# Patient Record
Sex: Male | Born: 1990 | Race: Black or African American | Hispanic: No | Marital: Single | Smoking: Current some day smoker
Health system: Southern US, Community
[De-identification: ages and names within clinical notes are randomized; demographics above are authoritative.]

## PROBLEM LIST (undated history)

## (undated) DIAGNOSIS — W3400XA Accidental discharge from unspecified firearms or gun, initial encounter: Secondary | ICD-10-CM

## (undated) HISTORY — PX: ABDOMINAL SURGERY: SHX537

---
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
## 2011-09-03 ENCOUNTER — Encounter (HOSPITAL_COMMUNITY): Payer: Self-pay

## 2011-09-03 ENCOUNTER — Emergency Department (HOSPITAL_COMMUNITY)
Admission: EM | Admit: 2011-09-03 | Discharge: 2011-09-03 | Disposition: A | Discharge status: Home / Self Care | Payer: Self-pay | Attending: Emergency Medicine | Admitting: Emergency Medicine

## 2011-09-03 ENCOUNTER — Emergency Department (HOSPITAL_COMMUNITY): Payer: Self-pay

## 2011-09-03 DIAGNOSIS — R42 Dizziness and giddiness: Secondary | ICD-10-CM | POA: Insufficient documentation

## 2011-09-03 DIAGNOSIS — R4182 Altered mental status, unspecified: Secondary | ICD-10-CM | POA: Insufficient documentation

## 2011-09-03 DIAGNOSIS — R0602 Shortness of breath: Secondary | ICD-10-CM | POA: Insufficient documentation

## 2011-09-03 DIAGNOSIS — R079 Chest pain, unspecified: Secondary | ICD-10-CM | POA: Insufficient documentation

## 2011-09-03 DIAGNOSIS — T07XXXA Unspecified multiple injuries, initial encounter: Secondary | ICD-10-CM

## 2011-09-03 DIAGNOSIS — S060XAA Concussion with loss of consciousness status unknown, initial encounter: Secondary | ICD-10-CM | POA: Insufficient documentation

## 2011-09-03 DIAGNOSIS — R05 Cough: Secondary | ICD-10-CM | POA: Insufficient documentation

## 2011-09-03 DIAGNOSIS — T148XXA Other injury of unspecified body region, initial encounter: Secondary | ICD-10-CM | POA: Insufficient documentation

## 2011-09-03 DIAGNOSIS — R209 Unspecified disturbances of skin sensation: Secondary | ICD-10-CM | POA: Insufficient documentation

## 2011-09-03 DIAGNOSIS — S060X9A Concussion with loss of consciousness of unspecified duration, initial encounter: Secondary | ICD-10-CM | POA: Insufficient documentation

## 2011-09-03 DIAGNOSIS — R059 Cough, unspecified: Secondary | ICD-10-CM | POA: Insufficient documentation

## 2011-09-03 MED ORDER — MORPHINE SULFATE 4 MG/ML IJ SOLN
4.0000 mg | Freq: Once | INTRAMUSCULAR | Status: AC
  Administered 2011-09-03: 4 mg via INTRAVENOUS
  Filled 2011-09-03: qty 1

## 2011-09-03 MED ORDER — NAPROXEN 500 MG PO TABS: 500.0000 mg | ORAL_TABLET | Freq: Two times a day (BID) | ORAL | Status: DC

## 2011-09-03 MED ORDER — HYDROCODONE-ACETAMINOPHEN 5-325 MG PO TABS: 1.0000 | ORAL_TABLET | Freq: Four times a day (QID) | ORAL | Status: AC | PRN

## 2011-09-03 MED ORDER — CYCLOBENZAPRINE HCL 5 MG PO TABS: 5.0000 mg | ORAL_TABLET | Freq: Three times a day (TID) | ORAL | Status: AC | PRN
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
## 2011-10-31 ENCOUNTER — Emergency Department (HOSPITAL_COMMUNITY)
Admission: EM | Admit: 2011-10-31 | Discharge: 2011-10-31 | Disposition: A | Discharge status: Home / Self Care | Payer: Self-pay | Attending: Emergency Medicine | Admitting: Emergency Medicine

## 2011-10-31 ENCOUNTER — Encounter (HOSPITAL_COMMUNITY): Payer: Self-pay | Admitting: Physical Medicine and Rehabilitation

## 2011-10-31 DIAGNOSIS — IMO0002 Reserved for concepts with insufficient information to code with codable children: Secondary | ICD-10-CM | POA: Insufficient documentation

## 2011-10-31 DIAGNOSIS — T07XXXA Unspecified multiple injuries, initial encounter: Secondary | ICD-10-CM

## 2011-10-31 DIAGNOSIS — S01501A Unspecified open wound of lip, initial encounter: Secondary | ICD-10-CM | POA: Insufficient documentation

## 2011-10-31 MED ORDER — IBUPROFEN 400 MG PO TABS: 400.0000 mg | ORAL_TABLET | Freq: Four times a day (QID) | ORAL | Status: AC | PRN

## 2011-10-31 MED ORDER — OXYCODONE-ACETAMINOPHEN 5-325 MG PO TABS
2.0000 | ORAL_TABLET | Freq: Once | ORAL | Status: AC
  Administered 2011-10-31: 2 via ORAL
  Filled 2011-10-31: qty 2

## 2011-10-31 NOTE — ED Notes (Signed)
Resident at bedside performing suture care, pt tolerating without difficulty at the time.

## 2011-10-31 NOTE — ED Provider Notes (Signed)
I saw and evaluated the patient, reviewed the resident's note and I agree with the findings and plan.  No headache no amnesia no loss of consciousness no neck pain no cervical spine tenderness he does have normal choking normal jaw occlusion dentition is stable but he does have an Rennis Harding type II fracture of tooth #9, upper lip laceration has been repaired by the resident with me immediately available for assistance.  Hurman Horn, MD 11/02/11 2227
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
## 2011-10-31 NOTE — Discharge Instructions (Signed)
Abrasions Abrasions are skin scrapes. Their treatment depends on how large and deep the abrasion is. Abrasions do not extend through all layers of the skin. A cut or lesion through all skin layers is called a laceration. HOME CARE INSTRUCTIONS   If you were given a dressing, change it at least once a day or as instructed by your caregiver. If the bandage sticks, soak it off with a solution of water or hydrogen peroxide.   Twice a day, wash the area with soap and water to remove all the cream/ointment. You may do this in a sink, under a tub faucet, or in a shower. Rinse off the soap and pat dry with a clean towel. Look for signs of infection (see below).   Reapply cream/ointment according to your caregiver's instruction. This will help prevent infection and keep the bandage from sticking. Telfa or gauze over the wound and under the dressing or wrap will also help keep the bandage from sticking.   If the bandage becomes wet, dirty, or develops a foul smell, change it as soon as possible.   Only take over-the-counter or prescription medicines for pain, discomfort, or fever as directed by your caregiver.  SEEK IMMEDIATE MEDICAL CARE IF:   Increasing pain in the wound.   Signs of infection develop: redness, swelling, surrounding area is tender to touch, or pus coming from the wound.   You have a fever.   Any foul smell coming from the wound or dressing.  Most skin wounds heal within ten days. Facial wounds heal faster. However, an infection may occur despite proper treatment. You should have the wound checked for signs of infection within 24 to 48 hours or sooner if problems arise. If you were not given a wound-check appointment, look closely at the wound yourself on the second day for early signs of infection listed above. MAKE SURE YOU:   Understand these instructions.   Will watch your condition.   Will get help right away if you are not doing well or get worse.  Document Released:  05/06/2005 Document Revised: 07/16/2011 Document Reviewed: 06/30/2011 [facility] Patient Information 2012 [place], [place].Laceration Care, Adult A laceration is a cut that goes through all layers of the skin. The cut goes into the tissue beneath the skin. HOME CARE For stitches (sutures) or staples:  Keep the cut clean and dry.   If you have a bandage (dressing), change it at least once a day. Change the bandage if it gets wet or dirty, or as told by your doctor.   Wash the cut with soap and water 2 times a day. Rinse the cut with water. Pat it dry with a clean towel.   Put a thin layer of medicated cream on the cut as told by your doctor.   You may shower after the first 24 hours. Do not soak the cut in water until the stitches are removed.   Only take medicines as told by your doctor.   Have your stitches or staples removed as told by your doctor.  For skin adhesive strips:  Keep the cut clean and dry.   Do not get the strips wet. You may take a bath, but be careful to keep the cut dry.   If the cut gets wet, pat it dry with a clean towel.   The strips will fall off on their own. Do not remove the strips that are still stuck to the cut.  For wound glue:  You may shower or take  baths. Do not soak or scrub the cut. Do not swim. Avoid heavy sweating until the glue falls off on its own. After a shower or bath, pat the cut dry with a clean towel.   Do not put medicine on your cut until the glue falls off.   If you have a bandage, do not put tape over the glue.   Avoid lots of sunlight or tanning lamps until the glue falls off. Put sunscreen on the cut for the first year to reduce your scar.   The glue will fall off on its own. Do not pick at the glue.  You may need a tetanus shot if:  You cannot remember when you had your last tetanus shot.   You have never had a tetanus shot.  If you need a tetanus shot and you choose not to have one, you may get tetanus. Sickness from tetanus  can be serious. GET HELP RIGHT AWAY IF:   Your pain does not get better with medicine.   Your arm, hand, leg, or foot loses feeling (numbness) or changes color.   Your cut is bleeding.   Your joint feels weak, or you cannot use your joint.   You have painful lumps on your body.   Your cut is red, puffy (swollen), or painful.   You have a red line on the skin near the cut.   You have yellowish-white fluid (pus) coming from the cut.   You have a fever.   You have a bad smell coming from the cut or bandage.   Your cut breaks open before or after stitches are removed.   You notice something coming out of the cut, such as wood or glass.   You cannot move a finger or toe.  MAKE SURE YOU:   Understand these instructions.   Will watch your condition.   Will get help right away if you are not doing well or get worse.  Document Released: 01/13/2008 Document Revised: 07/16/2011 Document Reviewed: 01/20/2011 [facility] Patient Information 2012 [place], [place].  Return for any new or worsening symptoms or any other concerns.
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
## 2011-12-24 ENCOUNTER — Emergency Department (HOSPITAL_COMMUNITY)
Admission: EM | Admit: 2011-12-24 | Discharge: 2011-12-24 | Disposition: A | Discharge status: Home / Self Care | Payer: Self-pay | Attending: Emergency Medicine | Admitting: Emergency Medicine

## 2011-12-24 ENCOUNTER — Emergency Department (HOSPITAL_COMMUNITY): Payer: Self-pay | Admitting: *Deleted

## 2011-12-24 ENCOUNTER — Encounter (HOSPITAL_COMMUNITY): Payer: Self-pay | Admitting: *Deleted

## 2011-12-24 ENCOUNTER — Encounter (HOSPITAL_COMMUNITY): Payer: Self-pay

## 2011-12-24 ENCOUNTER — Emergency Department (HOSPITAL_COMMUNITY): Payer: Self-pay

## 2011-12-24 ENCOUNTER — Encounter (HOSPITAL_COMMUNITY)
Admission: EM | Disposition: A | Discharge status: Home / Self Care | Payer: Self-pay | Source: Home / Self Care | Attending: Emergency Medicine

## 2011-12-24 DIAGNOSIS — L03019 Cellulitis of unspecified finger: Secondary | ICD-10-CM | POA: Insufficient documentation

## 2011-12-24 DIAGNOSIS — F17200 Nicotine dependence, unspecified, uncomplicated: Secondary | ICD-10-CM | POA: Insufficient documentation

## 2011-12-24 DIAGNOSIS — L02519 Cutaneous abscess of unspecified hand: Secondary | ICD-10-CM | POA: Insufficient documentation

## 2011-12-24 DIAGNOSIS — L0291 Cutaneous abscess, unspecified: Secondary | ICD-10-CM

## 2011-12-24 HISTORY — PX: I & D EXTREMITY: SHX5045

## 2011-12-24 LAB — CBC
HCT: 44 % (ref 39.0–52.0)
MCH: 33.1 pg (ref 26.0–34.0)
MCHC: 34.8 g/dL (ref 30.0–36.0)
MCV: 95.2 fL (ref 78.0–100.0)
Platelets: 260 10*3/uL (ref 150–400)
RDW: 12.9 % (ref 11.5–15.5)
WBC: 7.4 10*3/uL (ref 4.0–10.5)

## 2011-12-24 LAB — DIFFERENTIAL
Basophils Absolute: 0.1 10*3/uL (ref 0.0–0.1)
Basophils Relative: 1 % (ref 0–1)
Eosinophils Absolute: 0.4 10*3/uL (ref 0.0–0.7)
Eosinophils Relative: 5 % (ref 0–5)
Monocytes Absolute: 0.6 10*3/uL (ref 0.1–1.0)

## 2011-12-24 LAB — BASIC METABOLIC PANEL
CO2: 25 mEq/L (ref 19–32)
Calcium: 9.2 mg/dL (ref 8.4–10.5)
Creatinine, Ser: 0.82 mg/dL (ref 0.50–1.35)
GFR calc Af Amer: >90 mL/min (ref >90)
GFR calc non Af Amer: >90 mL/min (ref >90)
Sodium: 140 mEq/L (ref 135–145)

## 2011-12-24 SURGERY — IRRIGATION AND DEBRIDEMENT EXTREMITY
Anesthesia: General | Site: Finger | Laterality: Left | Wound class: Dirty or Infected

## 2011-12-24 MED ORDER — OXYCODONE-ACETAMINOPHEN 5-325 MG PO TABS: ORAL_TABLET | ORAL | Status: AC

## 2011-12-24 MED ORDER — VANCOMYCIN HCL IN DEXTROSE 1-5 GM/200ML-% IV SOLN
INTRAVENOUS | Status: AC
  Filled 2011-12-24: qty 200

## 2011-12-24 MED ORDER — DIPHENHYDRAMINE HCL 25 MG PO CAPS: 50.0000 mg | ORAL_CAPSULE | Freq: Once | ORAL | Status: DC

## 2011-12-24 MED ORDER — CEPHALEXIN 500 MG PO CAPS: 500.0000 mg | ORAL_CAPSULE | Freq: Four times a day (QID) | ORAL | Status: DC

## 2011-12-24 MED ORDER — LACTATED RINGERS IV SOLN
INTRAVENOUS | Status: DC | PRN
  Administered 2011-12-24: 20:00:00 via INTRAVENOUS

## 2011-12-24 MED ORDER — FENTANYL CITRATE 0.05 MG/ML IJ SOLN
INTRAMUSCULAR | Status: DC | PRN
  Administered 2011-12-24: 100 ug via INTRAVENOUS

## 2011-12-24 MED ORDER — CEPHALEXIN 250 MG PO CAPS
500.0000 mg | ORAL_CAPSULE | Freq: Once | ORAL | Status: AC
  Administered 2011-12-24: 500 mg via ORAL
  Filled 2011-12-24: qty 2

## 2011-12-24 MED ORDER — PROPOFOL 10 MG/ML IV EMUL
INTRAVENOUS | Status: DC | PRN
  Administered 2011-12-24: 200 mg via INTRAVENOUS

## 2011-12-24 MED ORDER — HYDROCODONE-ACETAMINOPHEN 5-325 MG PO TABS: 2.0000 | ORAL_TABLET | ORAL | Status: DC | PRN

## 2011-12-24 MED ORDER — BUPIVACAINE HCL (PF) 0.25 % IJ SOLN
INTRAMUSCULAR | Status: DC | PRN
  Administered 2011-12-24: 10 mL

## 2011-12-24 MED ORDER — EPHEDRINE SULFATE 50 MG/ML IJ SOLN
INTRAMUSCULAR | Status: DC | PRN
  Administered 2011-12-24 (×2): 5 mg via INTRAVENOUS

## 2011-12-24 MED ORDER — SULFAMETHOXAZOLE-TRIMETHOPRIM 800-160 MG PO TABS: 1.0000 | ORAL_TABLET | Freq: Two times a day (BID) | ORAL | Status: DC

## 2011-12-24 MED ORDER — SODIUM CHLORIDE 0.9 % IR SOLN
Status: DC | PRN
  Administered 2011-12-24: 3000 mL

## 2011-12-24 MED ORDER — CEFAZOLIN SODIUM 1-5 GM-% IV SOLN: 1.0000 g | Freq: Three times a day (TID) | INTRAVENOUS | Status: DC

## 2011-12-24 MED ORDER — SULFAMETHOXAZOLE-TMP DS 800-160 MG PO TABS
1.0000 | ORAL_TABLET | Freq: Once | ORAL | Status: AC
  Administered 2011-12-24: 1 via ORAL
  Filled 2011-12-24: qty 1

## 2011-12-24 MED ORDER — SULFAMETHOXAZOLE-TRIMETHOPRIM 800-160 MG PO TABS: 1.0000 | ORAL_TABLET | Freq: Two times a day (BID) | ORAL | Status: AC

## 2011-12-24 MED ORDER — HYDROMORPHONE HCL PF 1 MG/ML IJ SOLN: 0.2500 mg | INTRAMUSCULAR | Status: DC | PRN

## 2011-12-24 MED ORDER — LORAZEPAM 2 MG/ML IJ SOLN: 1.0000 mg | Freq: Once | INTRAMUSCULAR | Status: AC | PRN

## 2011-12-24 MED ORDER — VANCOMYCIN HCL 1000 MG IV SOLR
1000.0000 mg | INTRAVENOUS | Status: DC | PRN
  Administered 2011-12-24: 1000 mg via INTRAVENOUS

## 2011-12-24 MED ORDER — LIDOCAINE HCL (CARDIAC) 20 MG/ML IV SOLN
INTRAVENOUS | Status: DC | PRN
  Administered 2011-12-24: 100 mg via INTRAVENOUS

## 2011-12-24 MED ORDER — MIDAZOLAM HCL 5 MG/5ML IJ SOLN
INTRAMUSCULAR | Status: DC | PRN
  Administered 2011-12-24: 2 mg via INTRAVENOUS

## 2011-12-24 MED ORDER — ONDANSETRON HCL 4 MG/2ML IJ SOLN
INTRAMUSCULAR | Status: DC | PRN
  Administered 2011-12-24: 4 mg via INTRAVENOUS

## 2011-12-24 SURGICAL SUPPLY — 57 items
BANDAGE COBAN STERILE 2 (GAUZE/BANDAGES/DRESSINGS) IMPLANT
BANDAGE CONFORM 2  STR LF (GAUZE/BANDAGES/DRESSINGS) IMPLANT
BANDAGE ELASTIC 3 VELCRO ST LF (GAUZE/BANDAGES/DRESSINGS) ×2 IMPLANT
BANDAGE ELASTIC 4 VELCRO ST LF (GAUZE/BANDAGES/DRESSINGS) ×1 IMPLANT
BANDAGE GAUZE ELAST BULKY 4 IN (GAUZE/BANDAGES/DRESSINGS) ×2 IMPLANT
BNDG CMPR 9X4 STRL LF SNTH (GAUZE/BANDAGES/DRESSINGS) ×1
BNDG COHESIVE 1X5 TAN STRL LF (GAUZE/BANDAGES/DRESSINGS) IMPLANT
BNDG ESMARK 4X9 LF (GAUZE/BANDAGES/DRESSINGS) ×1 IMPLANT
CLOTH BEACON ORANGE TIMEOUT ST (SAFETY) ×2 IMPLANT
CORDS BIPOLAR (ELECTRODE) ×2 IMPLANT
COVER SURGICAL LIGHT HANDLE (MISCELLANEOUS) ×2 IMPLANT
DECANTER SPIKE VIAL GLASS SM (MISCELLANEOUS) ×1 IMPLANT
DRAIN PENROSE 1/4X12 LTX STRL (WOUND CARE) IMPLANT
DRSG ADAPTIC 3X8 NADH LF (GAUZE/BANDAGES/DRESSINGS) IMPLANT
DRSG EMULSION OIL 3X3 NADH (GAUZE/BANDAGES/DRESSINGS) ×1 IMPLANT
DRSG PAD ABDOMINAL 8X10 ST (GAUZE/BANDAGES/DRESSINGS) ×2 IMPLANT
GAUZE PACKING IODOFORM 1/4X5 (PACKING) ×1 IMPLANT
GAUZE XEROFORM 1X8 LF (GAUZE/BANDAGES/DRESSINGS) ×2 IMPLANT
GLOVE BIO SURGEON STRL SZ7.5 (GLOVE) ×2 IMPLANT
GLOVE BIOGEL PI IND STRL 8 (GLOVE) ×1 IMPLANT
GLOVE BIOGEL PI INDICATOR 8 (GLOVE) ×1
GOWN STRL NON-REIN LRG LVL3 (GOWN DISPOSABLE) ×1 IMPLANT
GOWN STRL REIN XL XLG (GOWN DISPOSABLE) ×2 IMPLANT
HAND ALUMI LG (SOFTGOODS) ×1 IMPLANT
HANDPIECE INTERPULSE COAX TIP (DISPOSABLE)
KIT BASIN OR (CUSTOM PROCEDURE TRAY) ×2 IMPLANT
KIT ROOM TURNOVER OR (KITS) ×2 IMPLANT
LOOP VESSEL MAXI BLUE (MISCELLANEOUS) IMPLANT
LOOP VESSEL MINI RED (MISCELLANEOUS) IMPLANT
MANIFOLD NEPTUNE II (INSTRUMENTS) ×2 IMPLANT
NDL HYPO 25X1 1.5 SAFETY (NEEDLE) IMPLANT
NEEDLE HYPO 25X1 1.5 SAFETY (NEEDLE) IMPLANT
NS IRRIG 1000ML POUR BTL (IV SOLUTION) ×1 IMPLANT
PACK ORTHO EXTREMITY (CUSTOM PROCEDURE TRAY) ×2 IMPLANT
PAD ARMBOARD 7.5X6 YLW CONV (MISCELLANEOUS) ×4 IMPLANT
SCRUB BETADINE 4OZ XXX (MISCELLANEOUS) ×2 IMPLANT
SET HNDPC FAN SPRY TIP SCT (DISPOSABLE) IMPLANT
SOLUTION BETADINE 4OZ (MISCELLANEOUS) ×2 IMPLANT
SPLINT PLASTER EXTRA FAST 3X15 (CAST SUPPLIES) ×1
SPLINT PLASTER GYPS XFAST 3X15 (CAST SUPPLIES) IMPLANT
SPONGE GAUZE 4X4 12PLY (GAUZE/BANDAGES/DRESSINGS) ×2 IMPLANT
SPONGE LAP 18X18 X RAY DECT (DISPOSABLE) ×1 IMPLANT
SPONGE LAP 4X18 X RAY DECT (DISPOSABLE) ×1 IMPLANT
SUCTION FRAZIER TIP 10 FR DISP (SUCTIONS) ×2 IMPLANT
SUT ETHILON 4 0 PS 2 18 (SUTURE) ×2 IMPLANT
SUT MON AB 5-0 P3 18 (SUTURE) IMPLANT
SWAB COLLECTION DEVICE MRSA (MISCELLANEOUS) ×1 IMPLANT
SYR CONTROL 10ML LL (SYRINGE) IMPLANT
SYRINGE 10CC LL (SYRINGE) ×1 IMPLANT
TOWEL OR 17X24 6PK STRL BLUE (TOWEL DISPOSABLE) ×2 IMPLANT
TOWEL OR 17X26 10 PK STRL BLUE (TOWEL DISPOSABLE) ×2 IMPLANT
TUBE ANAEROBIC SPECIMEN COL (MISCELLANEOUS) ×2 IMPLANT
TUBE CONNECTING 12X1/4 (SUCTIONS) ×2 IMPLANT
TUBE FEEDING 5FR 15 INCH (TUBING) ×2 IMPLANT
UNDERPAD 30X30 INCONTINENT (UNDERPADS AND DIAPERS) ×2 IMPLANT
WATER STERILE IRR 1000ML POUR (IV SOLUTION) ×1 IMPLANT
YANKAUER SUCT BULB TIP NO VENT (SUCTIONS) ×1 IMPLANT

## 2011-12-24 NOTE — Op Note (Signed)
Dictation [id number]

## 2011-12-24 NOTE — ED Notes (Signed)
MD at bedside. 

## 2011-12-24 NOTE — Anesthesia Postprocedure Evaluation (Signed)
  Anesthesia Post-op Note  Patient: Theodore Ford  Procedure(s) Performed: Procedure(s) (LRB): IRRIGATION AND DEBRIDEMENT EXTREMITY (Left)  Patient Location: PACU  Anesthesia Type: General  Level of Consciousness: awake  Airway and Oxygen Therapy: Patient Spontanous Breathing  Post-op Pain: mild  Post-op Assessment: Post-op Vital signs reviewed, Patient's Cardiovascular Status Stable, Respiratory Function Stable, Patent Airway, No signs of Nausea or vomiting and Pain level controlled  Post-op Vital Signs: stable  Complications: No apparent anesthesia complications

## 2011-12-24 NOTE — Preoperative (Signed)
Beta Blockers   Reason not to administer Beta Blockers:Not Applicable 

## 2011-12-24 NOTE — Anesthesia Preprocedure Evaluation (Addendum)
Anesthesia Evaluation  Patient identified by MRN, date of birth, ID band Patient awake    Reviewed: Allergy & Precautions, H&P , NPO status , Patient's Chart, lab work & pertinent test results  Airway Mallampati: I TM Distance: >3 FB Neck ROM: Full    Dental  (+) Teeth Intact and Dental Advisory Given,    Pulmonary Current Smoker,    Pulmonary exam normal       Cardiovascular     Neuro/Psych    GI/Hepatic (+)     substance abuse  marijuana use,   Endo/Other    Renal/GU      Musculoskeletal   Abdominal   Peds  Hematology   Anesthesia Other Findings   Reproductive/Obstetrics                          Anesthesia Physical Anesthesia Plan  ASA: II and Emergent  Anesthesia Plan: General   Post-op Pain Management:    Induction: Intravenous  Airway Management Planned: LMA  Additional Equipment:   Intra-op Plan:   Post-operative Plan: Extubation in OR  Informed Consent: I have reviewed the patients History and Physical, chart, labs and discussed the procedure including the risks, benefits and alternatives for the proposed anesthesia with the patient or authorized representative who has indicated his/her understanding and acceptance.     Plan Discussed with: CRNA and Surgeon  Anesthesia Plan Comments:         Anesthesia Quick Evaluation
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
## 2011-12-24 NOTE — ED Notes (Signed)
Pt has right pointer finger swelling and pain, sts noticed first 5 days ago getting wrose now
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
## 2011-12-24 NOTE — Discharge Instructions (Signed)
Abscess Care AfterCellulitis Cellulitis is an infection of the tissue under the skin. The infected area is usually red and tender. This is caused by germs. These germs enter the body through cuts or sores. This usually happens in the arms or lower legs. HOME CARE   Take your medicine as told. Finish it even if you start to feel better.   If the infection is on the arm or leg, keep it raised (elevated).   Use a warm cloth on the infected area several times a day.   See your doctor for a follow-up visit as told.  GET HELP RIGHT AWAY IF:   You are tired or confused.   You throw up (vomit).   You have watery poop (diarrhea).   You feel ill and have muscle aches.   You have a fever.  MAKE SURE YOU:   Understand these instructions.   Will watch your condition.   Will get help right away if you are not doing well or get worse.  Document Released: 01/13/2008 Document Revised: 07/16/2011 Document Reviewed: 06/28/2009 [facility] Patient Information 2012 [place], [place]. An abscess (also called a boil or furuncle) is an infected area that contains a collection of pus. Signs and symptoms of an abscess include pain, tenderness, redness, or hardness, or you may feel a moveable soft area under your skin. An abscess can occur anywhere in the body. The infection may spread to surrounding tissues causing cellulitis. A cut (incision) by the surgeon was made over your abscess and the pus was drained out. Gauze may have been packed into the space to provide a drain that will allow the cavity to heal from the inside outwards. The boil may be painful for 5 to 7 days. Most people with a boil do not have high fevers. Your abscess, if seen early, may not have localized, and may not have been lanced. If not, another appointment may be required for this if it does not get better on its own or with medications. HOME CARE INSTRUCTIONS   Only take over-the-counter or prescription medicines for pain, discomfort, or  fever as directed by your caregiver.   When you bathe, soak and then remove gauze or iodoform packs at least daily or as directed by your caregiver. You may then wash the wound gently with mild soapy water. Repack with gauze or do as your caregiver directs.  SEEK IMMEDIATE MEDICAL CARE IF:   You develop increased pain, swelling, redness, drainage, or bleeding in the wound site.   You develop signs of generalized infection including muscle aches, chills, fever, or a general ill feeling.   An oral temperature above 102 F (38.9 C) develops, not controlled by medication.  See your caregiver for a recheck if you develop any of the symptoms described above. If medications (antibiotics) were prescribed, take them as directed. Document Released: 02/12/2005 Document Revised: 07/16/2011 Document Reviewed: 10/10/2007 [facility] Patient Information 2012 [place], [place].   [facility] Instructions Hand Surgery  Wound Care: Keep your hand elevated above the level of your heart.  Do not allow it to dangle  by your side.  Keep the dressing dry and do not remove it unless your doctor advises you to do so.  He will usually change it at the time of your post-op visit.  Moving your fingers is advised to stimulate circulation but will depend on the site of your surgery.  If you have a splint applied, your doctor will advise you regarding movement.  Activity: Do not  drive or operate machinery today.  Rest today and then you may return to your normal activity and work as indicated by your physician.  Diet:  Drink liquids today or eat a light diet.  You may resume a regular diet tomorrow.    General expectations: Pain for two to three days. Fingers may become slightly swollen.  Call your doctor if any of the following occur: Severe pain not relieved by pain medication. Elevated temperature. Dressing soaked with blood. Inability to move fingers. White or bluish color to fingers.

## 2011-12-24 NOTE — Transfer of Care (Signed)
Immediate Anesthesia Transfer of Care Note  Patient: Theodore Ford  Procedure(s) Performed: Procedure(s) (LRB): IRRIGATION AND DEBRIDEMENT EXTREMITY (Left)  Patient Location: PACU  Anesthesia Type: General  Level of Consciousness: responds to stimulation  Airway & Oxygen Therapy: Patient Spontanous Breathing and Patient connected to nasal cannula oxygen  Post-op Assessment: Report given to PACU RN and Post -op Vital signs reviewed and stable  Post vital signs: Reviewed and stable  Complications: No apparent anesthesia complications

## 2011-12-25 ENCOUNTER — Encounter (HOSPITAL_COMMUNITY): Payer: Self-pay | Admitting: Orthopedic Surgery

## 2011-12-25 NOTE — Op Note (Signed)
[name], OYEN Ford.:  [id number]  MEDICAL RECORD Ford.:  [id number]  LOCATION:  [facility]                         FACILITY:  [facility]  PHYSICIAN:  Betha Loa, MD        DATE OF BIRTH:  Feb 20, 1991  DATE OF PROCEDURE:  12/24/2011 DATE OF DISCHARGE:                              OPERATIVE REPORT   PREOPERATIVE DIAGNOSIS:  Left index finger infection.  POSTOPERATIVE DIAGNOSIS:  Left index finger dorsal abscess and flexor tenosynovitis.  PROCEDURES:  Irrigation and debridement of left index finger dorsal abscess and flexor sheath.  SURGEON:  Betha Loa, MD  ASSISTANT:  None.  ANESTHESIA:  General.  IV FLUIDS:  Per anesthesia flow sheet.  ESTIMATED BLOOD LOSS:  Minimal.  COMPLICATIONS:  None.  SPECIMENS:  None.  CULTURES:  From dorsal wound and flexor sheath.  TOURNIQUET TIME:  48 minutes.  DISPOSITION:  Stable to PACU.  INDICATIONS:  Ford Ford is a 21 year old right-hand-dominant male who 2 weeks ago states he burned his left index finger.  The wound scabbed over.  He played basketball few days ago and then began to have swelling pain and erythema in the finger.  Presented to the emergency department today for care.  On evaluation, he was felt to have an abscess.  The dorsal wound was debrided by the emergency department.  I was consulted for evaluation.  He had been given a digital block prior to my evaluation.  On examination, he was started to begin to feel the finger again.  He noted tenderness over the dorsal and volar aspects of the middle phalanx.  There was a wound on the dorsal aspect.  There was erythema surrounding this.  There was Ford streaking.  There was swelling of the entire finger.  He was not tender over the proximal or distal phalanges.  I discussed with Mr. Melendrez the nature of the condition.  I recommended going to the operating room for irrigation and debridement of the finger.  Risks, benefits and alternatives of the surgery  were discussed including the risk of blood loss; infection; damage to nerves, vessels, tendons, ligaments, bone; failure of surgery; need for additional surgery; complications with wound healing; continued pain; continued infection, need for repeat irrigation and debridement.  He voiced understanding of these risks and elected to proceed.  OPERATIVE COURSE:  After being identified preoperatively by myself, the patient and I agreed upon the procedure and site of procedure.  The surgical site was marked.  The risks, benefits, and alternatives of surgery were reviewed and wished to proceed.  Surgical consent had been signed.  He was transported to the operating room and placed on the operating room table in supine position with left upper extremity on an armboard.  General anesthesia was induced by the anesthesiologist.  The left upper extremity was prepped and draped in normal sterile orthopedic fashion.  A surgical pause was performed between the surgeons, anesthesia, and operating room staff and all were in agreement as to the patient, procedure, and site of procedure.  Tourniquet at the proximal aspect of the extremity was inflated to 250 mmHg after gravity exsanguination of the hand and Esmarch exsanguination of the forearm. Incision was made over  the dorsal aspect of the finger incorporating the wound.  There was gross purulence within this.  This was cultured for aerobes, anaerobes, AFB, and fungus.  The wound was debrided.  The skin edges were debrided sharply using the knife.  The subcutaneous tissues were debrided with a Ray-Tec sponge and curette.  There was not noted to be any fluid underneath the extensor tendon.  Attention was turned to the volar aspect of the finger.  An incision was made over the middle phalanx.  This was carried into the subcutaneous tissues.  There was edema here.  There was Ford gross purulence.  A small rent was made in the flexor tendon sheath.  There was  significant amount of yellowish fluid that was somewhat thick within the flexor sheath.  It was felt that it was appropriate to irrigate the flexor sheath.  An incision was made over the volar aspect at the MP joint of the index finger and carried into the subcutaneous tissues again by spreading technique.  The A1 pulley was opened here and a #5 pediatric feeding tube introduced into the flexor sheath.  A 300 mL of sterile saline was irrigated through the pediatric feeding tube.  There was good effluent from both proximal and distal wounds.  The sheath was irrigated until the effluent was entirely clear.  The pediatric feeding tube was placed into the distal aspect of the sheath and irrigation commenced there as well.  Again, there was good effluent.  The dorsal wound was irrigated with 3000 mL of sterile saline by cysto tubing.  All wounds were packed with 0.25-inch iodoform gauze.  The wounds were dressed with sterile Xeroform and 4x4s.  A digital block had been performed with 10 mL of 0.25% plain Marcaine to aid in postoperative analgesia.  The wounds were dressed with sterile Xeroform, 4x4s, and wrapped with a Kerlix bandage. A volar splint was placed including the index, long and ring fingers for postoperative comfort.  This was wrapped with Kerlix and Ace bandage. Tourniquet was deflated at 48 minutes.  The fingertips were pink with brisk capillary refill after deflation of the tourniquet.  Operative drapes were broken down.  The patient was awakened from anesthesia safely.  He was transferred back to the stretcher and taken to PACU in stable condition.  I will see him back in the office the beginning of next week for postoperative followup and initiation of hydrotherapy.  I will give him Percocet 5/325 1-2 p.o. q.6 hours p.r.n. pain, dispensed #40 and Bactrim DS 1 p.o. b.i.d. x7 days.     Betha Loa, MD     KK/MEDQ  D:  12/24/2011  T:  12/25/2011  Job:  [id number]

## 2011-12-27 LAB — CULTURE, ROUTINE-ABSCESS

## 2011-12-28 LAB — BODY FLUID CULTURE

## 2011-12-29 LAB — ANAEROBIC CULTURE: Gram Stain: NONE SEEN

## 2015-05-22 ENCOUNTER — Emergency Department (HOSPITAL_COMMUNITY): Payer: No Typology Code available for payment source | Admitting: Certified Registered"

## 2015-05-22 ENCOUNTER — Inpatient Hospital Stay (HOSPITAL_COMMUNITY): Payer: No Typology Code available for payment source

## 2015-05-22 ENCOUNTER — Emergency Department (HOSPITAL_COMMUNITY): Payer: No Typology Code available for payment source

## 2015-05-22 ENCOUNTER — Encounter (HOSPITAL_COMMUNITY): Payer: Self-pay | Admitting: *Deleted

## 2015-05-22 ENCOUNTER — Encounter (HOSPITAL_COMMUNITY): Admission: EM | Disposition: A | Discharge status: Home / Self Care | Payer: Self-pay | Source: Home / Self Care

## 2015-05-22 ENCOUNTER — Inpatient Hospital Stay (HOSPITAL_COMMUNITY)
Admission: EM | Admit: 2015-05-22 | Discharge: 2015-05-27 | DRG: 330 | Disposition: A | Discharge status: Home / Self Care | Payer: No Typology Code available for payment source | Attending: General Surgery | Admitting: General Surgery

## 2015-05-22 DIAGNOSIS — Z01818 Encounter for other preprocedural examination: Secondary | ICD-10-CM

## 2015-05-22 DIAGNOSIS — S21339A Puncture wound without foreign body of unspecified front wall of thorax with penetration into thoracic cavity, initial encounter: Secondary | ICD-10-CM

## 2015-05-22 DIAGNOSIS — K661 Hemoperitoneum: Principal | ICD-10-CM | POA: Diagnosis present

## 2015-05-22 DIAGNOSIS — S31803A Puncture wound without foreign body of unspecified buttock, initial encounter: Secondary | ICD-10-CM

## 2015-05-22 DIAGNOSIS — D62 Acute posthemorrhagic anemia: Secondary | ICD-10-CM | POA: Diagnosis not present

## 2015-05-22 DIAGNOSIS — S31129A Laceration of abdominal wall with foreign body, unspecified quadrant without penetration into peritoneal cavity, initial encounter: Secondary | ICD-10-CM | POA: Diagnosis present

## 2015-05-22 DIAGNOSIS — S3599XA Other specified injury of unspecified blood vessel at abdomen, lower back and pelvis level, initial encounter: Secondary | ICD-10-CM

## 2015-05-22 DIAGNOSIS — D696 Thrombocytopenia, unspecified: Secondary | ICD-10-CM | POA: Diagnosis not present

## 2015-05-22 DIAGNOSIS — S31822A Laceration with foreign body of left buttock, initial encounter: Secondary | ICD-10-CM | POA: Diagnosis present

## 2015-05-22 DIAGNOSIS — W3400XA Accidental discharge from unspecified firearms or gun, initial encounter: Secondary | ICD-10-CM

## 2015-05-22 DIAGNOSIS — IMO0002 Reserved for concepts with insufficient information to code with codable children: Secondary | ICD-10-CM | POA: Diagnosis present

## 2015-05-22 HISTORY — PX: SMALL BOWEL REPAIR: SHX6447

## 2015-05-22 HISTORY — PX: TRANSVERSE COLON RESECTION: SHX6155

## 2015-05-22 HISTORY — PX: LAPAROTOMY: SHX154

## 2015-05-22 HISTORY — PX: BOWEL RESECTION: SHX1257

## 2015-05-22 LAB — PROTIME-INR
INR: 1.25 (ref 0.00–1.49)
INR: 1.76 — ABNORMAL HIGH (ref 0.00–1.49)
INR: 1.5 — AB (ref 0.00–1.49)
PROTHROMBIN TIME: 20.5 s — AB (ref 11.6–15.2)
PROTHROMBIN TIME: 18.1 s — AB (ref 11.6–15.2)
Prothrombin Time: 15.9 seconds — ABNORMAL HIGH (ref 11.6–15.2)

## 2015-05-22 LAB — COMPREHENSIVE METABOLIC PANEL
ALK PHOS: 39 U/L (ref 38–126)
ALT: 16 U/L — AB (ref 17–63)
ALT: 16 U/L — AB (ref 17–63)
AST: 43 U/L — AB (ref 15–41)
AST: 26 U/L (ref 15–41)
Albumin: 2.8 g/dL — ABNORMAL LOW (ref 3.5–5.0)
Albumin: 1.6 g/dL — ABNORMAL LOW (ref 3.5–5.0)
Alkaline Phosphatase: 28 U/L — ABNORMAL LOW (ref 38–126)
Anion gap: 9 (ref 5–15)
Anion gap: 5 (ref 5–15)
BILIRUBIN TOTAL: 0.8 mg/dL (ref 0.3–1.2)
BUN: 8 mg/dL (ref 6–20)
BUN: 9 mg/dL (ref 6–20)
CALCIUM: 8 mg/dL — AB (ref 8.9–10.3)
CHLORIDE: 104 mmol/L (ref 101–111)
CHLORIDE: 111 mmol/L (ref 101–111)
CO2: 24 mmol/L (ref 22–32)
CO2: 23 mmol/L (ref 22–32)
CREATININE: 1.35 mg/dL — AB (ref 0.61–1.24)
CREATININE: 0.88 mg/dL (ref 0.61–1.24)
Calcium: 7.7 mg/dL — ABNORMAL LOW (ref 8.9–10.3)
GFR calc Af Amer: >60 mL/min (ref >60)
GFR calc Af Amer: >60 mL/min (ref >60)
GLUCOSE: 174 mg/dL — AB (ref 65–99)
Glucose, Bld: 124 mg/dL — ABNORMAL HIGH (ref 65–99)
Potassium: 3.7 mmol/L (ref 3.5–5.1)
Potassium: 4.5 mmol/L (ref 3.5–5.1)
Sodium: 137 mmol/L (ref 135–145)
Sodium: 139 mmol/L (ref 135–145)
Total Bilirubin: 0.4 mg/dL (ref 0.3–1.2)
Total Protein: 4.6 g/dL — ABNORMAL LOW (ref 6.5–8.1)
Total Protein: 3 g/dL — ABNORMAL LOW (ref 6.5–8.1)

## 2015-05-22 LAB — ABO/RH: ABO/RH(D): O POS

## 2015-05-22 LAB — POCT I-STAT 3, ART BLOOD GAS (G3+)
Acid-base deficit: 1 mmol/L (ref 0.0–2.0)
Bicarbonate: 22.8 meq/L (ref 20.0–24.0)
O2 Saturation: 100 %
PCO2 ART: 31.2 mmHg — AB (ref 35.0–45.0)
PH ART: 7.468 — AB (ref 7.350–7.450)
Patient temperature: 97.2
TCO2: 24 mmol/L (ref 0–100)
pO2, Arterial: 228 mmHg — ABNORMAL HIGH (ref 80.0–100.0)

## 2015-05-22 LAB — BASIC METABOLIC PANEL
Anion gap: 4 — ABNORMAL LOW (ref 5–15)
BUN: 9 mg/dL (ref 6–20)
CHLORIDE: 105 mmol/L (ref 101–111)
CO2: 25 mmol/L (ref 22–32)
Calcium: 7.2 mg/dL — ABNORMAL LOW (ref 8.9–10.3)
Creatinine, Ser: 1.02 mg/dL (ref 0.61–1.24)
Glucose, Bld: 130 mg/dL — ABNORMAL HIGH (ref 65–99)
POTASSIUM: 4.2 mmol/L (ref 3.5–5.1)
SODIUM: 134 mmol/L — AB (ref 135–145)

## 2015-05-22 LAB — CBC
HCT: 42.8 % (ref 39.0–52.0)
HEMATOCRIT: 30.7 % — AB (ref 39.0–52.0)
Hemoglobin: 13.9 g/dL (ref 13.0–17.0)
Hemoglobin: 10.5 g/dL — ABNORMAL LOW (ref 13.0–17.0)
MCH: 31.7 pg (ref 26.0–34.0)
MCH: 31.3 pg (ref 26.0–34.0)
MCHC: 32.5 g/dL (ref 30.0–36.0)
MCHC: 34.2 g/dL (ref 30.0–36.0)
MCV: 97.7 fL (ref 78.0–100.0)
MCV: 91.4 fL (ref 78.0–100.0)
PLATELETS: 138 10*3/uL — AB (ref 150–400)
PLATELETS: 112 10*3/uL — AB (ref 150–400)
RBC: 4.38 MIL/uL (ref 4.22–5.81)
RBC: 3.36 MIL/uL — AB (ref 4.22–5.81)
RDW: 13 % (ref 11.5–15.5)
RDW: 13.8 % (ref 11.5–15.5)
WBC: 7.3 10*3/uL (ref 4.0–10.5)
WBC: 10.9 10*3/uL — AB (ref 4.0–10.5)

## 2015-05-22 LAB — URINALYSIS, ROUTINE W REFLEX MICROSCOPIC
BILIRUBIN URINE: NEGATIVE
Glucose, UA: NEGATIVE mg/dL
Hgb urine dipstick: NEGATIVE
Ketones, ur: 15 mg/dL — AB
LEUKOCYTES UA: NEGATIVE
NITRITE: NEGATIVE
PH: 6 (ref 5.0–8.0)
Protein, ur: NEGATIVE mg/dL
SPECIFIC GRAVITY, URINE: 1.025 (ref 1.005–1.030)
Urobilinogen, UA: 1 mg/dL (ref 0.0–1.0)

## 2015-05-22 LAB — SURGICAL PCR SCREEN
MRSA, PCR: NEGATIVE
STAPHYLOCOCCUS AUREUS: POSITIVE — AB

## 2015-05-22 LAB — TRIGLYCERIDES: Triglycerides: 23 mg/dL (ref <150)

## 2015-05-22 LAB — PREPARE RBC (CROSSMATCH)

## 2015-05-22 LAB — ETHANOL

## 2015-05-22 LAB — CDS SEROLOGY

## 2015-05-22 SURGERY — LAPAROTOMY, EXPLORATORY
Anesthesia: General | Site: Abdomen

## 2015-05-22 MED ORDER — HEMOSTATIC AGENTS (NO CHARGE) OPTIME
TOPICAL | Status: DC | PRN
  Administered 2015-05-22 (×3): 1 via TOPICAL

## 2015-05-22 MED ORDER — FENTANYL CITRATE (PF) 100 MCG/2ML IJ SOLN
INTRAMUSCULAR | Status: AC
  Filled 2015-05-22: qty 2

## 2015-05-22 MED ORDER — PHENYLEPHRINE HCL 10 MG/ML IJ SOLN
INTRAMUSCULAR | Status: DC | PRN
  Administered 2015-05-22: 120 ug via INTRAVENOUS

## 2015-05-22 MED ORDER — PHENYLEPHRINE HCL 10 MG/ML IJ SOLN
10.0000 mg | INTRAVENOUS | Status: DC | PRN
  Administered 2015-05-22: 30 ug/min via INTRAVENOUS

## 2015-05-22 MED ORDER — FENTANYL CITRATE (PF) 100 MCG/2ML IJ SOLN
50.0000 ug | Freq: Once | INTRAMUSCULAR | Status: AC
  Administered 2015-05-22: 50 ug via INTRAVENOUS

## 2015-05-22 MED ORDER — DEXTROSE 5 % IV SOLN
0.0000 ug/min | INTRAVENOUS | Status: DC
  Administered 2015-05-22: 10 ug/min via INTRAVENOUS
  Administered 2015-05-23: 30 ug/min via INTRAVENOUS
  Administered 2015-05-23: 25 ug/min via INTRAVENOUS
  Filled 2015-05-22 (×3): qty 1

## 2015-05-22 MED ORDER — ROCURONIUM BROMIDE 50 MG/5ML IV SOLN
INTRAVENOUS | Status: AC
  Filled 2015-05-22: qty 1

## 2015-05-22 MED ORDER — ROCURONIUM BROMIDE 50 MG/5ML IV SOLN
INTRAVENOUS | Status: AC
  Filled 2015-05-22: qty 2

## 2015-05-22 MED ORDER — LIDOCAINE HCL (CARDIAC) 20 MG/ML IV SOLN
INTRAVENOUS | Status: AC
  Filled 2015-05-22: qty 5

## 2015-05-22 MED ORDER — PROPOFOL 1000 MG/100ML IV EMUL
0.0000 ug/kg/min | INTRAVENOUS | Status: DC
  Administered 2015-05-22 – 2015-05-23 (×2): 20 ug/kg/min via INTRAVENOUS
  Filled 2015-05-22: qty 100

## 2015-05-22 MED ORDER — ANTISEPTIC ORAL RINSE SOLUTION (CORINZ)
7.0000 mL | OROMUCOSAL | Status: DC
  Administered 2015-05-22 – 2015-05-23 (×6): 7 mL via OROMUCOSAL

## 2015-05-22 MED ORDER — ROCURONIUM BROMIDE 50 MG/5ML IV SOLN
INTRAVENOUS | Status: AC | PRN
Start: 2015-05-22 — End: 2015-05-22
  Administered 2015-05-22: 100 mg via INTRAVENOUS

## 2015-05-22 MED ORDER — ETOMIDATE 2 MG/ML IV SOLN
INTRAVENOUS | Status: AC | PRN
  Administered 2015-05-22: 20 mg via INTRAVENOUS

## 2015-05-22 MED ORDER — SODIUM CHLORIDE 0.9 % IV SOLN: Freq: Once | INTRAVENOUS | Status: DC

## 2015-05-22 MED ORDER — FENTANYL CITRATE (PF) 250 MCG/5ML IJ SOLN
INTRAMUSCULAR | Status: DC | PRN
  Administered 2015-05-22 (×5): 50 ug via INTRAVENOUS

## 2015-05-22 MED ORDER — LACTATED RINGERS IV SOLN
INTRAVENOUS | Status: DC | PRN
  Administered 2015-05-22 (×3): via INTRAVENOUS

## 2015-05-22 MED ORDER — MIDAZOLAM HCL 2 MG/2ML IJ SOLN
INTRAMUSCULAR | Status: AC
  Filled 2015-05-22: qty 4

## 2015-05-22 MED ORDER — PANTOPRAZOLE SODIUM 40 MG PO TBEC
40.0000 mg | DELAYED_RELEASE_TABLET | Freq: Every day | ORAL | Status: DC
  Administered 2015-05-25 – 2015-05-26 (×2): 40 mg via ORAL
  Filled 2015-05-22 (×3): qty 1

## 2015-05-22 MED ORDER — PROPOFOL 1000 MG/100ML IV EMUL
INTRAVENOUS | Status: AC
  Filled 2015-05-22: qty 100

## 2015-05-22 MED ORDER — ONDANSETRON HCL 4 MG/2ML IJ SOLN: 4.0000 mg | Freq: Four times a day (QID) | INTRAMUSCULAR | Status: DC | PRN

## 2015-05-22 MED ORDER — PHENYLEPHRINE HCL 10 MG/ML IJ SOLN
INTRAMUSCULAR | Status: AC
  Filled 2015-05-22: qty 1

## 2015-05-22 MED ORDER — SODIUM CHLORIDE 0.9 % IV SOLN
INTRAVENOUS | Status: DC | PRN
  Administered 2015-05-22 (×3): via INTRAVENOUS

## 2015-05-22 MED ORDER — DEXTROSE 5 % IV SOLN
2.0000 g | Freq: Two times a day (BID) | INTRAVENOUS | Status: DC
  Administered 2015-05-22: 2 g via INTRAVENOUS
  Filled 2015-05-22: qty 2

## 2015-05-22 MED ORDER — ONDANSETRON HCL 4 MG PO TABS: 4.0000 mg | ORAL_TABLET | Freq: Four times a day (QID) | ORAL | Status: DC | PRN

## 2015-05-22 MED ORDER — PHENYLEPHRINE 40 MCG/ML (10ML) SYRINGE FOR IV PUSH (FOR BLOOD PRESSURE SUPPORT)
PREFILLED_SYRINGE | INTRAVENOUS | Status: AC
  Filled 2015-05-22: qty 10

## 2015-05-22 MED ORDER — ROCURONIUM BROMIDE 100 MG/10ML IV SOLN
INTRAVENOUS | Status: DC | PRN
  Administered 2015-05-22: 10 mg via INTRAVENOUS
  Administered 2015-05-22: 20 mg via INTRAVENOUS
  Administered 2015-05-22 (×2): 10 mg via INTRAVENOUS

## 2015-05-22 MED ORDER — FENTANYL CITRATE (PF) 250 MCG/5ML IJ SOLN
INTRAMUSCULAR | Status: AC
  Filled 2015-05-22: qty 5

## 2015-05-22 MED ORDER — ETOMIDATE 2 MG/ML IV SOLN
INTRAVENOUS | Status: AC
  Filled 2015-05-22: qty 20

## 2015-05-22 MED ORDER — SENNOSIDES 8.8 MG/5ML PO SYRP
5.0000 mL | ORAL_SOLUTION | Freq: Two times a day (BID) | ORAL | Status: DC | PRN
  Filled 2015-05-22: qty 5

## 2015-05-22 MED ORDER — PANTOPRAZOLE SODIUM 40 MG IV SOLR
40.0000 mg | Freq: Every day | INTRAVENOUS | Status: DC
  Administered 2015-05-22 – 2015-05-24 (×3): 40 mg via INTRAVENOUS
  Filled 2015-05-22 (×5): qty 40

## 2015-05-22 MED ORDER — BISACODYL 10 MG RE SUPP: 10.0000 mg | Freq: Every day | RECTAL | Status: DC | PRN

## 2015-05-22 MED ORDER — FENTANYL BOLUS VIA INFUSION
50.0000 ug | INTRAVENOUS | Status: DC | PRN
  Administered 2015-05-23: 50 ug via INTRAVENOUS
  Filled 2015-05-22: qty 50

## 2015-05-22 MED ORDER — 0.9 % SODIUM CHLORIDE (POUR BTL) OPTIME
TOPICAL | Status: DC | PRN
  Administered 2015-05-22 (×7): 1000 mL

## 2015-05-22 MED ORDER — CALCIUM CHLORIDE 10 % IV SOLN
INTRAVENOUS | Status: AC
  Filled 2015-05-22: qty 10

## 2015-05-22 MED ORDER — SODIUM CHLORIDE 0.9 % IV SOLN
25.0000 ug/h | INTRAVENOUS | Status: DC
  Administered 2015-05-22: 50 ug/h via INTRAVENOUS
  Filled 2015-05-22 (×2): qty 50

## 2015-05-22 MED ORDER — CHLORHEXIDINE GLUCONATE 0.12% ORAL RINSE (MEDLINE KIT)
15.0000 mL | Freq: Two times a day (BID) | OROMUCOSAL | Status: DC
  Administered 2015-05-22 – 2015-05-23 (×2): 15 mL via OROMUCOSAL

## 2015-05-22 MED ORDER — FENTANYL CITRATE (PF) 100 MCG/2ML IJ SOLN
INTRAMUSCULAR | Status: AC | PRN
  Administered 2015-05-22: 100 ug via INTRAVENOUS

## 2015-05-22 MED ORDER — ANTISEPTIC ORAL RINSE SOLUTION (CORINZ): 7.0000 mL | Freq: Four times a day (QID) | OROMUCOSAL | Status: DC

## 2015-05-22 MED ORDER — MIDAZOLAM HCL 5 MG/5ML IJ SOLN
INTRAMUSCULAR | Status: DC | PRN
  Administered 2015-05-22: 2 mg via INTRAVENOUS

## 2015-05-22 MED ORDER — CALCIUM CHLORIDE 10 % IV SOLN
INTRAVENOUS | Status: DC | PRN
  Administered 2015-05-22: 1000 mg via INTRAVENOUS

## 2015-05-22 MED ORDER — CHLORHEXIDINE GLUCONATE 0.12% ORAL RINSE (MEDLINE KIT): 15.0000 mL | Freq: Two times a day (BID) | OROMUCOSAL | Status: DC

## 2015-05-22 MED ORDER — SODIUM CHLORIDE 0.9 % IV SOLN
INTRAVENOUS | Status: AC | PRN
Start: 2015-05-22 — End: 2015-05-22
  Administered 2015-05-22: 1000 mL via INTRAVENOUS

## 2015-05-22 MED ORDER — KCL IN DEXTROSE-NACL 20-5-0.45 MEQ/L-%-% IV SOLN
INTRAVENOUS | Status: DC
  Administered 2015-05-22: 125 mL/h via INTRAVENOUS
  Administered 2015-05-23: 02:00:00 via INTRAVENOUS
  Administered 2015-05-23: 125 mL/h via INTRAVENOUS
  Administered 2015-05-24 – 2015-05-26 (×4): via INTRAVENOUS
  Filled 2015-05-22 (×10): qty 1000

## 2015-05-22 MED ORDER — SUCCINYLCHOLINE CHLORIDE 20 MG/ML IJ SOLN
INTRAMUSCULAR | Status: AC
  Filled 2015-05-22: qty 1

## 2015-05-22 SURGICAL SUPPLY — 66 items
BANDAGE HEMOSTAT MRDH 4X4 STRL (MISCELLANEOUS) IMPLANT
BLADE SURG ROTATE 9660 (MISCELLANEOUS) ×1 IMPLANT
BNDG HEMOSTAT MRDH 4X4 STRL (MISCELLANEOUS) ×2
BRR ADH 5X3 SEPRAFILM 6 SHT (MISCELLANEOUS)
CANISTER SUCTION 2500CC (MISCELLANEOUS) ×1 IMPLANT
CHLORAPREP W/TINT 26ML (MISCELLANEOUS) ×2 IMPLANT
CLIP TI LARGE 6 (CLIP) ×1 IMPLANT
CLIP TI MEDIUM 24 (CLIP) ×1 IMPLANT
CLIP TI WIDE RED SMALL 24 (CLIP) ×1 IMPLANT
COVER MAYO STAND STRL (DRAPES) IMPLANT
COVER SURGICAL LIGHT HANDLE (MISCELLANEOUS) ×2 IMPLANT
DRAPE LAPAROSCOPIC ABDOMINAL (DRAPES) ×2 IMPLANT
DRAPE PROXIMA HALF (DRAPES) IMPLANT
DRAPE UTILITY XL STRL (DRAPES) ×4 IMPLANT
DRAPE WARM FLUID 44X44 (DRAPE) ×2 IMPLANT
DRSG MEPILEX BORDER 4X4 (GAUZE/BANDAGES/DRESSINGS) ×1 IMPLANT
DRSG OPSITE POSTOP 4X10 (GAUZE/BANDAGES/DRESSINGS) IMPLANT
DRSG OPSITE POSTOP 4X8 (GAUZE/BANDAGES/DRESSINGS) IMPLANT
ELECT BLADE 6.5 EXT (BLADE) ×1 IMPLANT
ELECT CAUTERY BLADE 6.4 (BLADE) ×4 IMPLANT
ELECT REM PT RETURN 9FT ADLT (ELECTROSURGICAL) ×2
ELECTRODE REM PT RTRN 9FT ADLT (ELECTROSURGICAL) ×1 IMPLANT
GLOVE BIO SURGEON STRL SZ 6.5 (GLOVE) ×1 IMPLANT
GLOVE BIOGEL PI IND STRL 7.0 (GLOVE) IMPLANT
GLOVE BIOGEL PI IND STRL 7.5 (GLOVE) IMPLANT
GLOVE BIOGEL PI IND STRL 8 (GLOVE) ×1 IMPLANT
GLOVE BIOGEL PI INDICATOR 7.0 (GLOVE) ×2
GLOVE BIOGEL PI INDICATOR 7.5 (GLOVE) ×1
GLOVE BIOGEL PI INDICATOR 8 (GLOVE) ×2
GLOVE ECLIPSE 7.5 STRL STRAW (GLOVE) ×3 IMPLANT
GLOVE SS BIOGEL STRL SZ 7 (GLOVE) IMPLANT
GLOVE SUPERSENSE BIOGEL SZ 7 (GLOVE) ×2
GLOVE SURG SS PI 6.5 STRL IVOR (GLOVE) ×1 IMPLANT
GLOVE SURG SS PI 7.0 STRL IVOR (GLOVE) ×1 IMPLANT
GOWN STRL REUS W/ TWL LRG LVL3 (GOWN DISPOSABLE) ×3 IMPLANT
GOWN STRL REUS W/TWL LRG LVL3 (GOWN DISPOSABLE) ×10
KIT BASIN OR (CUSTOM PROCEDURE TRAY) ×2 IMPLANT
KIT PREVENA INCISION MGT20CM45 (CANNISTER) ×1 IMPLANT
KIT ROOM TURNOVER OR (KITS) ×2 IMPLANT
LIGASURE IMPACT 36 18CM CVD LR (INSTRUMENTS) ×1 IMPLANT
MANIFOLD NEPTUNE II (INSTRUMENTS) ×1 IMPLANT
NS IRRIG 1000ML POUR BTL (IV SOLUTION) ×4 IMPLANT
PACK GENERAL/GYN (CUSTOM PROCEDURE TRAY) ×2 IMPLANT
PAD ARMBOARD 7.5X6 YLW CONV (MISCELLANEOUS) ×2 IMPLANT
PENCIL BUTTON HOLSTER BLD 10FT (ELECTRODE) IMPLANT
RELOAD PROXIMATE 75MM BLUE (ENDOMECHANICALS) ×12 IMPLANT
RELOAD STAPLE 75 3.8 BLU REG (ENDOMECHANICALS) IMPLANT
SEPRAFILM PROCEDURAL PACK 3X5 (MISCELLANEOUS) IMPLANT
SPECIMEN JAR LARGE (MISCELLANEOUS) ×1 IMPLANT
SPONGE LAP 18X18 X RAY DECT (DISPOSABLE) ×5 IMPLANT
STAPLER GUN LINEAR PROX 60 (STAPLE) ×1 IMPLANT
STAPLER PROXIMATE 75MM BLUE (STAPLE) ×1 IMPLANT
STAPLER VISISTAT 35W (STAPLE) ×2 IMPLANT
SUCTION POOLE TIP (SUCTIONS) ×2 IMPLANT
SUT NOVA 1 T20/GS 25DT (SUTURE) IMPLANT
SUT PDS AB 1 TP1 96 (SUTURE) ×4 IMPLANT
SUT SILK 2 0 SH CR/8 (SUTURE) ×3 IMPLANT
SUT SILK 2 0 TIES 10X30 (SUTURE) ×2 IMPLANT
SUT SILK 3 0 SH CR/8 (SUTURE) ×4 IMPLANT
SUT SILK 3 0 TIES 10X30 (SUTURE) ×2 IMPLANT
SUT VIC AB 2-0 SH 27 (SUTURE) ×2
SUT VIC AB 2-0 SH 27X BRD (SUTURE) IMPLANT
TOWEL OR 17X26 10 PK STRL BLUE (TOWEL DISPOSABLE) ×2 IMPLANT
TRAY FOLEY CATH 16FRSI W/METER (SET/KITS/TRAYS/PACK) IMPLANT
TUBE CONNECTING 12X1/4 (SUCTIONS) IMPLANT
YANKAUER SUCT BULB TIP NO VENT (SUCTIONS) IMPLANT
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
## 2015-05-22 NOTE — OR Nursing (Signed)
Foreign body given to B. Apple, RN who called security to retrieve item.
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
## 2015-05-22 NOTE — OR Nursing (Signed)
Foreign body released by charge nurse Mateo FlowBrandi Landi Biscardi, [facility] Earlie ServerElliott Crawford at [phone]

## 2015-05-22 NOTE — Transfer of Care (Signed)
Immediate Anesthesia Transfer of Care Note  Patient: Theodore Ford  Procedure(s) Performed: Procedure(s): EXPLORATORY LAPAROTOMY (N/A) SMALL BOWEL RESECTION (N/A) Repair of transverse colon mesentary; repair pre sacral venous plexus (N/A) Repair small bowel interotomy x2 (N/A)  Patient Location: SICU  Anesthesia Type:General  Level of Consciousness: sedated, unresponsive and Patient remains intubated per anesthesia plan  Airway & Oxygen Therapy: Patient remains intubated per anesthesia plan and Patient placed on Ventilator (see vital sign flow sheet for setting)  Post-op Assessment: Report given to RN and Post -op Vital signs reviewed and stable  Post vital signs: Reviewed and stable  Last Vitals:  Filed Vitals:   05/22/15 1435  BP:   Pulse:   Temp: 34.6 C  Resp:     Complications: No apparent anesthesia complications
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
## 2015-05-22 NOTE — Anesthesia Preprocedure Evaluation (Signed)
Anesthesia Evaluation   Patient unresponsive    Reviewed: Unable to perform ROS - Chart review onlyPreop documentation limited or incomplete due to emergent nature of procedure.  Airway Mallampati: Intubated       Dental   Pulmonary    breath sounds clear to auscultation       Cardiovascular  Rhythm:Regular Rate:Normal     Neuro/Psych    GI/Hepatic   Endo/Other    Renal/GU      Musculoskeletal   Abdominal   Peds  Hematology   Anesthesia Other Findings   Reproductive/Obstetrics                             Anesthesia Physical Anesthesia Plan  ASA: IV and emergent  Anesthesia Plan: General   Post-op Pain Management:    Induction: Inhalational  Airway Management Planned: Oral ETT  Additional Equipment: Arterial line  Intra-op Plan:   Post-operative Plan: Post-operative intubation/ventilation  Informed Consent: I have reviewed the patients History and Physical, chart, labs and discussed the procedure including the risks, benefits and alternatives for the proposed anesthesia with the patient or authorized representative who has indicated his/her understanding and acceptance.     Plan Discussed with: CRNA and Surgeon  Anesthesia Plan Comments:         Anesthesia Quick Evaluation

## 2015-05-22 NOTE — Progress Notes (Signed)
   05/22/15 1400  Clinical Encounter Type  Visited With Family  Visit Type Code  Referral From Nurse  Stress Factors  Family Stress Factors (Finding out if her cousin is ok)  Chaplain visited with Healthcare providers and family member; Lunette StandsChaplain is available to both family member and Pt; Chaplain will await call once the Pt has been discharged from surgery

## 2015-05-22 NOTE — Anesthesia Procedure Notes (Signed)
Date/Time: 05/22/2015 1:59 PM Performed by: Jerilee HohMUMM, Karli Wickizer N Pre-anesthesia Checklist: Patient identified, Emergency Drugs available, Suction available and Patient being monitored Patient Re-evaluated:Patient Re-evaluated prior to inductionOxygen Delivery Method: Circle system utilized Intubation Type: Inhalational induction with existing ETT Tube size: 7.5 mm Placement Confirmation: positive ETCO2 and breath sounds checked- equal and bilateral Secured at: 23 cm

## 2015-05-23 ENCOUNTER — Encounter (HOSPITAL_COMMUNITY): Payer: Self-pay | Admitting: General Surgery

## 2015-05-23 LAB — BASIC METABOLIC PANEL
Anion gap: 6 (ref 5–15)
BUN: 8 mg/dL (ref 6–20)
CHLORIDE: 105 mmol/L (ref 101–111)
CO2: 26 mmol/L (ref 22–32)
Calcium: 7.3 mg/dL — ABNORMAL LOW (ref 8.9–10.3)
Creatinine, Ser: 0.98 mg/dL (ref 0.61–1.24)
GFR calc Af Amer: >60 mL/min (ref >60)
GFR calc non Af Amer: >60 mL/min (ref >60)
GLUCOSE: 126 mg/dL — AB (ref 65–99)
POTASSIUM: 4.3 mmol/L (ref 3.5–5.1)
Sodium: 137 mmol/L (ref 135–145)

## 2015-05-23 LAB — BLOOD PRODUCT ORDER (VERBAL) VERIFICATION

## 2015-05-23 LAB — CBC
HCT: 25.9 % — ABNORMAL LOW (ref 39.0–52.0)
HEMATOCRIT: 21 % — AB (ref 39.0–52.0)
HEMOGLOBIN: 9 g/dL — AB (ref 13.0–17.0)
Hemoglobin: 7.2 g/dL — ABNORMAL LOW (ref 13.0–17.0)
MCH: 31.3 pg (ref 26.0–34.0)
MCH: 30.6 pg (ref 26.0–34.0)
MCHC: 34.7 g/dL (ref 30.0–36.0)
MCHC: 34.3 g/dL (ref 30.0–36.0)
MCV: 89.9 fL (ref 78.0–100.0)
MCV: 89.4 fL (ref 78.0–100.0)
PLATELETS: 88 10*3/uL — AB (ref 150–400)
Platelets: 96 10*3/uL — ABNORMAL LOW (ref 150–400)
RBC: 2.88 MIL/uL — AB (ref 4.22–5.81)
RBC: 2.35 MIL/uL — ABNORMAL LOW (ref 4.22–5.81)
RDW: 14.1 % (ref 11.5–15.5)
RDW: 14.1 % (ref 11.5–15.5)
WBC: 8.6 10*3/uL (ref 4.0–10.5)
WBC: 9.2 10*3/uL (ref 4.0–10.5)

## 2015-05-23 LAB — POCT I-STAT 7, (LYTES, BLD GAS, ICA,H+H)
ACID-BASE DEFICIT: 1 mmol/L (ref 0.0–2.0)
Acid-Base Excess: 1 mmol/L (ref 0.0–2.0)
Bicarbonate: 27.9 meq/L — ABNORMAL HIGH (ref 20.0–24.0)
Bicarbonate: 23.8 meq/L (ref 20.0–24.0)
CALCIUM ION: 0.96 mmol/L — AB (ref 1.12–1.23)
Calcium, Ion: 1.09 mmol/L — ABNORMAL LOW (ref 1.12–1.23)
HEMATOCRIT: 34 % — AB (ref 39.0–52.0)
HEMATOCRIT: 23 % — AB (ref 39.0–52.0)
HEMOGLOBIN: 7.8 g/dL — AB (ref 13.0–17.0)
Hemoglobin: 11.6 g/dL — ABNORMAL LOW (ref 13.0–17.0)
O2 SAT: 100 %
O2 Saturation: 100 %
PCO2 ART: 50.8 mmHg — AB (ref 35.0–45.0)
PCO2 ART: 33.6 mmHg — AB (ref 35.0–45.0)
POTASSIUM: 3.1 mmol/L — AB (ref 3.5–5.1)
POTASSIUM: 3.3 mmol/L — AB (ref 3.5–5.1)
Patient temperature: 35.5
Patient temperature: 34.9
SODIUM: 141 mmol/L (ref 135–145)
Sodium: 143 mmol/L (ref 135–145)
TCO2: 30 mmol/L (ref 0–100)
TCO2: 25 mmol/L (ref 0–100)
pH, Arterial: 7.341 — ABNORMAL LOW (ref 7.350–7.450)
pH, Arterial: 7.449 (ref 7.350–7.450)
pO2, Arterial: 555 mmHg — ABNORMAL HIGH (ref 80.0–100.0)
pO2, Arterial: 539 mmHg — ABNORMAL HIGH (ref 80.0–100.0)

## 2015-05-23 LAB — PREPARE FRESH FROZEN PLASMA
UNIT DIVISION: 0
UNIT DIVISION: 0
UNIT DIVISION: 0
Unit division: 0
Unit division: 0
Unit division: 0

## 2015-05-23 LAB — PREPARE RBC (CROSSMATCH)

## 2015-05-23 LAB — GLUCOSE, CAPILLARY: GLUCOSE-CAPILLARY: 127 mg/dL — AB (ref 65–99)

## 2015-05-23 MED ORDER — ALBUMIN HUMAN 5 % IV SOLN
25.0000 g | Freq: Once | INTRAVENOUS | Status: AC
  Administered 2015-05-23: 25 g via INTRAVENOUS
  Filled 2015-05-23: qty 500

## 2015-05-23 MED ORDER — NALOXONE HCL 0.4 MG/ML IJ SOLN: 0.4000 mg | INTRAMUSCULAR | Status: DC | PRN

## 2015-05-23 MED ORDER — CHLORHEXIDINE GLUCONATE CLOTH 2 % EX PADS
6.0000 | MEDICATED_PAD | Freq: Every day | CUTANEOUS | Status: DC
  Administered 2015-05-24 – 2015-05-27 (×4): 6 via TOPICAL

## 2015-05-23 MED ORDER — DIPHENHYDRAMINE HCL 50 MG/ML IJ SOLN
12.5000 mg | Freq: Four times a day (QID) | INTRAMUSCULAR | Status: DC | PRN
  Administered 2015-05-23: 12.5 mg via INTRAVENOUS
  Filled 2015-05-23: qty 1

## 2015-05-23 MED ORDER — CETYLPYRIDINIUM CHLORIDE 0.05 % MT LIQD
7.0000 mL | Freq: Two times a day (BID) | OROMUCOSAL | Status: DC
  Administered 2015-05-24 – 2015-05-27 (×6): 7 mL via OROMUCOSAL

## 2015-05-23 MED ORDER — DIPHENHYDRAMINE HCL 12.5 MG/5ML PO ELIX: 12.5000 mg | ORAL_SOLUTION | Freq: Four times a day (QID) | ORAL | Status: DC | PRN

## 2015-05-23 MED ORDER — HYDROMORPHONE 0.3 MG/ML IV SOLN
INTRAVENOUS | Status: DC
  Administered 2015-05-23: 19:00:00 via INTRAVENOUS
  Administered 2015-05-23: 0.799 mg via INTRAVENOUS
  Administered 2015-05-24: 1.5 mg via INTRAVENOUS
  Administered 2015-05-24: 1.2 mg via INTRAVENOUS
  Administered 2015-05-24: 3 mg via INTRAVENOUS
  Filled 2015-05-23: qty 25

## 2015-05-23 MED ORDER — SODIUM CHLORIDE 0.9 % IJ SOLN: 9.0000 mL | INTRAMUSCULAR | Status: DC | PRN

## 2015-05-23 MED ORDER — MUPIROCIN 2 % EX OINT
1.0000 "application " | TOPICAL_OINTMENT | Freq: Two times a day (BID) | CUTANEOUS | Status: DC
  Administered 2015-05-24 – 2015-05-27 (×7): 1 via NASAL
  Filled 2015-05-23 (×2): qty 22

## 2015-05-23 MED ORDER — MUPIROCIN 2 % EX OINT: 1.0000 "application " | TOPICAL_OINTMENT | Freq: Two times a day (BID) | CUTANEOUS | Status: DC

## 2015-05-23 MED ORDER — ONDANSETRON HCL 4 MG/2ML IJ SOLN: 4.0000 mg | Freq: Four times a day (QID) | INTRAMUSCULAR | Status: DC | PRN

## 2015-05-23 MED ORDER — DEXTROSE 5 % IV SOLN
1.0000 g | Freq: Three times a day (TID) | INTRAVENOUS | Status: AC
  Administered 2015-05-23 – 2015-05-24 (×3): 1 g via INTRAVENOUS
  Filled 2015-05-23 (×3): qty 1
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
## 2015-05-23 NOTE — Anesthesia Postprocedure Evaluation (Signed)
  Anesthesia Post-op Note  Patient: Theodore Ford  Procedure(s) Performed: Procedure(s) (LRB): EXPLORATORY LAPAROTOMY (N/A) SMALL BOWEL RESECTION (N/A) Repair of transverse colon mesentary; repair pre sacral venous plexus (N/A) Repair small bowel interotomy x2 (N/A)  Patient Location: PACU  Anesthesia Type: General  Level of Consciousness: awake and alert   Airway and Oxygen Therapy: Patient Spontanous Breathing  Post-op Pain: mild  Post-op Assessment: Post-op Vital signs reviewed, Patient's Cardiovascular Status Stable, Respiratory Function Stable, Patent Airway and No signs of Nausea or vomiting  Last Vitals:  Filed Vitals:   05/23/15 2341  BP:   Pulse:   Temp: 37.3 C  Resp:     Post-op Vital Signs: stable   Complications: No apparent anesthesia complications

## 2015-05-23 NOTE — Care Management Note (Signed)
Case Management Note  Patient Details  Name: Fawn KirkChristopher Lantigua MRN: [id number] Date of Birth: 04/19/1991  Subjective/Objective:                    Action/Plan:  Initial UR completed.  Expected Discharge Date:                  Expected Discharge Plan:     In-House Referral:     Discharge planning Services     Post Acute Care Choice:    Choice offered to:     DME Arranged:    DME Agency:     HH Arranged:    HH Agency:     Status of Service:  In process, will continue to follow  Medicare Important Message Given:    Date Medicare IM Given:    Medicare IM give by:    Date Additional Medicare IM Given:    Additional Medicare Important Message give by:     If discussed at Long Length of Stay Meetings, dates discussed:    Additional Comments:  Kingsley PlanWile, Rick Warnick Marie, RN 05/23/2015, 7:35 AM

## 2015-05-23 NOTE — Progress Notes (Signed)
This Rn and Roselie AwkwardShannon Love, RN to bedside to check-off blood before hanging.  It was discovered that pt last name was spelled differently on blood slip and armband.  Blood slip spelled-[place] while armband/blood bank band spelled-[place].  All other indentifiers (first name, birth date, MRN and blood bank number) matched as well as the blood tag and PRBC bag itself.  This RN called blood bank and spoke to [place].  Clydie BraunKaren stated that since 3 identifiers matched that it was ok to transfuse.  Name on pt record in EPIC had been updated many times to add/remove XXX.  Pt registration called by Roselie AwkwardShannon Love, RN to have EPIC updated with correct spelling.  Charge RN Alyce Paganllison Haggard made aware of situation.

## 2015-05-23 NOTE — Progress Notes (Signed)
Pt placed back on full support due to low minute volume and low volumes after receiving pain meds due to pain.  RT will continue to monitor.

## 2015-05-23 NOTE — Progress Notes (Signed)
Follow up - Trauma and Critical Care  Patient Details:    Theodore Ford is an 24 y.o. male.  Lines/tubes : Airway 7.5 mm (Active)  Secured at (cm) 23 cm 05/23/2015  8:53 AM  Measured From Lips 05/23/2015  8:53 AM  Secured Location Left 05/23/2015  8:53 AM  Secured By [employer] 05/23/2015  8:53 AM  Tube Holder Repositioned Yes 05/23/2015  8:24 AM  Cuff Pressure (cm H2O) 24 cm H2O 05/22/2015 11:57 PM  Site Condition Dry 05/23/2015  3:13 AM     Arterial Line 05/22/15 Left Radial (Active)  Site Assessment Clean;Dry;Intact 05/22/2015  8:00 PM  Line Status Pulsatile blood flow 05/22/2015  8:00 PM  Art Line Waveform Appropriate 05/22/2015  8:00 PM  Art Line Interventions Leveled 05/22/2015  8:00 PM  Color/Movement/Sensation Capillary refill less than 3 sec 05/22/2015  8:00 PM  Dressing Type Transparent 05/22/2015  8:00 PM  Dressing Status Clean;Dry;Intact 05/22/2015  8:00 PM     Negative Pressure Wound Therapy Abdomen (Active)  Last dressing change 05/22/15 05/22/2015  4:45 PM  Site / Wound Assessment Clean;Dry 05/22/2015  4:45 PM  Peri-wound Assessment Intact 05/22/2015  8:00 PM  Cycle Continuous 05/22/2015  8:00 PM  Target Pressure (mmHg) 125 05/22/2015  8:00 PM  Dressing Status Intact 05/22/2015  8:00 PM  Drainage Amount None 05/22/2015  8:00 PM  Output (mL) 0 mL 05/23/2015  4:00 AM     NG/OG Tube Nasogastric 16 Fr. Right nare (Active)  Placement Verification Auscultation 05/22/2015  8:00 PM  Site Assessment Clean;Dry;Intact 05/22/2015  8:00 PM  Status Suction-low intermittent 05/22/2015  8:00 PM  Drainage Appearance Brown 05/22/2015  8:00 PM  Intake (mL) 30 mL 05/23/2015  4:00 AM  Output (mL) 500 mL 05/23/2015  4:00 AM     Urethral Catheter Lindie Spruce, MD Latex 16 Fr. (Active)  Indication for Insertion or Continuance of Catheter Unstable critical patients (first 24-48 hours) 05/23/2015  8:00 AM  Site Assessment Clean;Intact 05/22/2015  8:00 PM  Catheter  Maintenance Bag below level of bladder;Catheter secured;Drainage bag/tubing not touching floor;Insertion date on drainage bag;No dependent loops;Seal intact 05/23/2015  8:00 AM  Collection Container Standard drainage bag 05/22/2015  8:00 PM  Securement Method Leg strap 05/22/2015  8:00 PM  Urinary Catheter Interventions Unclamped 05/22/2015  4:45 PM  Output (mL) 40 mL 05/23/2015  6:00 AM    Microbiology/Sepsis markers: Results for orders placed or performed during the hospital encounter of 05/22/15  Surgical pcr screen     Status: Abnormal   Collection Time: 05/22/15  4:30 PM  Result Value Ref Range Status   MRSA, PCR NEGATIVE NEGATIVE Final   Staphylococcus aureus POSITIVE (A) NEGATIVE Final    Comment:        The Xpert SA Assay (FDA approved for NASAL specimens in patients over 33 years of age), is one component of a comprehensive surveillance program.  Test performance has been validated by [facility] for patients greater than or equal to 86 year old. It is not intended to diagnose infection nor to guide or monitor treatment.     Anti-infectives:  Anti-infectives    Start     Dose/Rate Route Frequency Ordered Stop   05/22/15 1415  cefoTEtan (CEFOTAN) 2 g in dextrose 5 % 50 mL IVPB  Status:  Discontinued     2 g 100 mL/hr over 30 Minutes Intravenous Every 12 hours 05/22/15 1413 05/22/15 1611      Best Practice/Protocols:  VTE Prophylaxis: Mechanical GI Prophylaxis:  Proton Pump Inhibitor Continous Sedation  Consults: Treatment Team:  Larina Earthly, MD    Events:  Subjective:    Overnight Issues: Patient has been awake and alert on the ventilator, but did not tolerate weaning this morning at all.  Probably will be able to get extubated later today.  Objective:  Vital signs for last 24 hours: Temp:  [94.2 F (34.6 C)-99.5 F (37.5 C)] 97.6 F (36.4 C) (10/13 0700) Pulse Rate:  [53-109] 80 (10/13 0824) Resp:  [12-25] 12 (10/13 0824) BP: (79-186)/(33-85)  87/49 mmHg (10/13 0700) SpO2:  [93 %-100 %] 98 % (10/13 0824) Arterial Line BP: (65-187)/(32-86) 104/50 mmHg (10/13 0700) FiO2 (%):  [40 %-100 %] 40 % (10/13 0853) Weight:  [70.2 kg (154 lb 12.2 oz)-72.576 kg (160 lb)] 70.2 kg (154 lb 12.2 oz) (10/13 0430)  Hemodynamic parameters for last 24 hours:    Intake/Output from previous day: 10/12 0701 - 10/13 0700 In: 8807.9 [I.V.:6268.9; Blood:2419; NG/GT:120] Out: 2680 [Urine:980; Emesis/NG output:500; Blood:1200]  Intake/Output this shift:    Vent settings for last 24 hours: Vent Mode:  [-] PRVC FiO2 (%):  [40 %-100 %] 40 % Set Rate:  [14 bmp] 14 bmp Vt Set:  [550 mL] 550 mL PEEP:  [5 cmH20] 5 cmH20 Pressure Support:  [5 cmH20] 5 cmH20 Plateau Pressure:  [13 cmH20-18 cmH20] 17 cmH20  Physical Exam:  General: alert and no respiratory distress Neuro: alert, oriented and nonfocal exam Resp: clear to auscultation bilaterally GI: hypoactive BS, wound clean and has NPWT in place and NGT. Extremities: no edema, no erythema, pulses WNL  Results for orders placed or performed during the hospital encounter of 05/22/15 (from the past 24 hour(s))  Prepare fresh frozen plasma     Status: None (Preliminary result)   Collection Time: 05/22/15  1:36 PM  Result Value Ref Range   Unit Number [id number]    Blood Component Type LIQ PLASMA    Unit division 00    Status of Unit ISSUED    Unit tag comment VERBAL ORDERS PER DR RANCOUR    Transfusion Status OK TO TRANSFUSE    Unit Number [id number]    Blood Component Type LIQ PLASMA    Unit division 00    Status of Unit ISSUED    Unit tag comment VERBAL ORDERS PER DR RANCOUR    Transfusion Status OK TO TRANSFUSE    Unit Number [id number]    Blood Component Type THAWED PLASMA    Unit division 00    Status of Unit REL FROM [facility]    Unit tag comment VERBAL ORDERS PER DR RANCOUR    Transfusion Status OK TO TRANSFUSE    Unit Number [id number]    Blood Component Type THAWED PLASMA     Unit division 00    Status of Unit REL FROM [facility]    Unit tag comment VERBAL ORDERS PER DR RANCOUR    Transfusion Status OK TO TRANSFUSE   Type and screen     Status: None (Preliminary result)   Collection Time: 05/22/15  1:41 PM  Result Value Ref Range   ABO/RH(D) O POS    Antibody Screen NEG    Sample Expiration 05/25/2015    Unit Number [id number]    Blood Component Type RED CELLS,LR    Unit division 00    Status of Unit REL FROM [facility]    Unit tag comment VERBAL ORDERS PER DR RANCOUR    Transfusion Status OK TO TRANSFUSE  Crossmatch Result COMPATIBLE    Unit Number [id number]    Blood Component Type RED CELLS,LR    Unit division 00    [facility]    Unit tag comment VERBAL ORDERS PER DR RANCOUR    Transfusion Status OK TO TRANSFUSE    Crossmatch Result COMPATIBLE    Unit Number [id number]    Blood Component Type RBC LR PHER1    Unit division 00    Status of Unit ISSUED    Unit tag comment VERBAL ORDERS PER DR RANCOUR    Transfusion Status OK TO TRANSFUSE    Crossmatch Result COMPATIBLE    Unit Number [id number]    Blood Component Type RED CELLS,LR    Unit division 00    Status of Unit ISSUED    Unit tag comment VERBAL ORDERS PER DR RANCOUR    Transfusion Status OK TO TRANSFUSE    Crossmatch Result COMPATIBLE    Unit Number [id number]    Blood Component Type RBC LR PHER1    Unit division 00    [facility]    Transfusion Status OK TO TRANSFUSE    Crossmatch Result COMPATIBLE    Unit tag comment VERBAL ORDERS PER DR Anneta Rounds    Unit Number [id number]    Blood Component Type RBC LR PHER1    Unit division 00    [facility]    Transfusion Status OK TO TRANSFUSE    Crossmatch Result COMPATIBLE    Unit tag comment VERBAL ORDERS PER DR Mikella Linsley    Unit Number [id number]    Blood Component Type RED CELLS,LR    Unit division 00    [facility] OK TO [facility]     Crossmatch Result COMPATIBLE    Unit tag comment VERBAL ORDERS PER DR Tobechukwu Emmick    Unit Number [id number]    Blood Component Type RBC LR PHER2    Unit division 00    [facility]    Transfusion Status OK TO TRANSFUSE    Crossmatch Result COMPATIBLE    Unit tag comment VERBAL ORDERS PER DR Keelin Sheridan    Unit Number [id number]    Blood Component Type RED CELLS,LR    Unit division 00    Status of Unit ISSUED    Transfusion Status OK TO TRANSFUSE    Crossmatch Result Compatible    Unit Number [id number]    Blood Component Type RED CELLS,LR    Unit division 00    Status of Unit ISSUED    Transfusion Status OK TO TRANSFUSE    Crossmatch Result Compatible   CDS serology     Status: None   Collection Time: 05/22/15  1:41 PM  Result Value Ref Range   CDS serology specimen STAT   Comprehensive metabolic panel     Status: Abnormal   Collection Time: 05/22/15  1:41 PM  Result Value Ref Range   Sodium 137 135 - 145 mmol/L   Potassium 3.7 3.5 - 5.1 mmol/L   Chloride 104 101 - 111 mmol/L   CO2 24 22 - 32 mmol/L   Glucose, Bld 124 (H) 65 - 99 mg/dL   BUN 8 6 - 20 mg/dL   Creatinine, Ser 3.151.35 (H) 0.61 - 1.24 mg/dL   Calcium 8.0 (L) 8.9 - 10.3 mg/dL   Total Protein 4.6 (L) 6.5 - 8.1 g/dL   Albumin 2.8 (  L) 3.5 - 5.0 g/dL   AST 43 (H) 15 - 41 U/L   ALT 16 (L) 17 - 63 U/L   Alkaline Phosphatase 39 38 - 126 U/L   Total Bilirubin 0.8 0.3 - 1.2 mg/dL   GFR calc non Af Amer >60 >60 mL/min   GFR calc Af Amer >60 >60 mL/min   Anion gap 9 5 - 15  CBC     Status: Abnormal   Collection Time: 05/22/15  1:41 PM  Result Value Ref Range   WBC 7.3 4.0 - 10.5 K/uL   RBC 4.38 4.22 - 5.81 MIL/uL   Hemoglobin 13.9 13.0 - 17.0 g/dL   HCT 08.6 57.8 - 46.9 %   MCV 97.7 78.0 - 100.0 fL   MCH 31.7 26.0 - 34.0 pg   MCHC 32.5 30.0 - 36.0 g/dL   RDW 62.9 52.8 - 41.3 %   Platelets 138 (L) 150 - 400 K/uL  Ethanol     Status: None   Collection Time: 05/22/15  1:41 PM  Result Value Ref  Range   Alcohol, Ethyl (B) <5 <5 mg/dL  Protime-INR     Status: Abnormal   Collection Time: 05/22/15  1:41 PM  Result Value Ref Range   Prothrombin Time 15.9 (H) 11.6 - 15.2 seconds   INR 1.25 0.00 - 1.49  ABO/Rh     Status: None   Collection Time: 05/22/15  1:41 PM  Result Value Ref Range   ABO/RH(D) O POS   Prepare RBC (crossmatch)     Status: None   Collection Time: 05/22/15  1:41 PM  Result Value Ref Range   Order Confirmation ORDER PROCESSED BY BLOOD BANK   I-STAT 7, (LYTES, BLD GAS, ICA, H+H)     Status: Abnormal   Collection Time: 05/22/15  2:13 PM  Result Value Ref Range   pH, Arterial 7.341 (L) 7.350 - 7.450   pCO2 arterial 50.8 (H) 35.0 - 45.0 mmHg   pO2, Arterial 555.0 (H) 80.0 - 100.0 mmHg   Bicarbonate 27.9 (H) 20.0 - 24.0 mEq/L   TCO2 30 0 - 100 mmol/L   O2 Saturation 100.0 %   Acid-Base Excess 1.0 0.0 - 2.0 mmol/L   Sodium 143 135 - 145 mmol/L   Potassium 3.1 (L) 3.5 - 5.1 mmol/L   Calcium, Ion 1.09 (L) 1.12 - 1.23 mmol/L   HCT 34.0 (L) 39.0 - 52.0 %   Hemoglobin 11.6 (L) 13.0 - 17.0 g/dL   Patient temperature 24.4 C    Sample type ARTERIAL   Urinalysis, Routine w reflex microscopic (not at [facility])     Status: Abnormal   Collection Time: 05/22/15  2:15 PM  Result Value Ref Range   Color, Urine YELLOW YELLOW   APPearance HAZY (A) CLEAR   Specific Gravity, Urine 1.025 1.005 - 1.030   pH 6.0 5.0 - 8.0   Glucose, UA NEGATIVE NEGATIVE mg/dL   Hgb urine dipstick NEGATIVE NEGATIVE   Bilirubin Urine NEGATIVE NEGATIVE   Ketones, ur 15 (A) NEGATIVE mg/dL   Protein, ur NEGATIVE NEGATIVE mg/dL   Urobilinogen, UA 1.0 0.0 - 1.0 mg/dL   Nitrite NEGATIVE NEGATIVE   Leukocytes, UA NEGATIVE NEGATIVE  I-STAT 7, (LYTES, BLD GAS, ICA, H+H)     Status: Abnormal   Collection Time: 05/22/15  3:04 PM  Result Value Ref Range   pH, Arterial 7.449 7.350 - 7.450   pCO2 arterial 33.6 (L) 35.0 - 45.0 mmHg   pO2, Arterial 539.0 (H) 80.0 -  100.0 mmHg   Bicarbonate 23.8 20.0 - 24.0  mEq/L   TCO2 25 0 - 100 mmol/L   O2 Saturation 100.0 %   Acid-base deficit 1.0 0.0 - 2.0 mmol/L   Sodium 141 135 - 145 mmol/L   Potassium 3.3 (L) 3.5 - 5.1 mmol/L   Calcium, Ion 0.96 (L) 1.12 - 1.23 mmol/L   HCT 23.0 (L) 39.0 - 52.0 %   Hemoglobin 7.8 (L) 13.0 - 17.0 g/dL   Patient temperature 19.1 C    Sample type ARTERIAL   CBC     Status: Abnormal   Collection Time: 05/22/15  4:30 PM  Result Value Ref Range   WBC 10.9 (H) 4.0 - 10.5 K/uL   RBC 3.36 (L) 4.22 - 5.81 MIL/uL   Hemoglobin 10.5 (L) 13.0 - 17.0 g/dL   HCT 47.8 (L) 29.5 - 62.1 %   MCV 91.4 78.0 - 100.0 fL   MCH 31.3 26.0 - 34.0 pg   MCHC 34.2 30.0 - 36.0 g/dL   RDW 30.8 65.7 - 84.6 %   Platelets 112 (L) 150 - 400 K/uL  Comprehensive metabolic panel     Status: Abnormal   Collection Time: 05/22/15  4:30 PM  Result Value Ref Range   Sodium 139 135 - 145 mmol/L   Potassium 4.5 3.5 - 5.1 mmol/L   Chloride 111 101 - 111 mmol/L   CO2 23 22 - 32 mmol/L   Glucose, Bld 174 (H) 65 - 99 mg/dL   BUN 9 6 - 20 mg/dL   Creatinine, Ser 9.62 0.61 - 1.24 mg/dL   Calcium 7.7 (L) 8.9 - 10.3 mg/dL   Total Protein 3.0 (L) 6.5 - 8.1 g/dL   Albumin 1.6 (L) 3.5 - 5.0 g/dL   AST 26 15 - 41 U/L   ALT 16 (L) 17 - 63 U/L   Alkaline Phosphatase 28 (L) 38 - 126 U/L   Total Bilirubin 0.4 0.3 - 1.2 mg/dL   GFR calc non Af Amer >60 >60 mL/min   GFR calc Af Amer >60 >60 mL/min   Anion gap 5 5 - 15  Protime-INR     Status: Abnormal   Collection Time: 05/22/15  4:30 PM  Result Value Ref Range   Prothrombin Time 20.5 (H) 11.6 - 15.2 seconds   INR 1.76 (H) 0.00 - 1.49  Surgical pcr screen     Status: Abnormal   Collection Time: 05/22/15  4:30 PM  Result Value Ref Range   MRSA, PCR NEGATIVE NEGATIVE   Staphylococcus aureus POSITIVE (A) NEGATIVE  I-STAT 3, arterial blood gas (G3+)     Status: Abnormal   Collection Time: 05/22/15  4:34 PM  Result Value Ref Range   pH, Arterial 7.468 (H) 7.350 - 7.450   pCO2 arterial 31.2 (L) 35.0 - 45.0  mmHg   pO2, Arterial 228.0 (H) 80.0 - 100.0 mmHg   Bicarbonate 22.8 20.0 - 24.0 mEq/L   TCO2 24 0 - 100 mmol/L   O2 Saturation 100.0 %   Acid-base deficit 1.0 0.0 - 2.0 mmol/L   Patient temperature 97.2 F    Collection site ARTERIAL LINE    Drawn by Nurse    Sample type ARTERIAL   Triglycerides     Status: None   Collection Time: 05/22/15  5:09 PM  Result Value Ref Range   Triglycerides 23 <150 mg/dL  Prepare fresh frozen plasma     Status: None (Preliminary result)   Collection Time: 05/22/15  6:40 PM  Result Value Ref Range   Unit Number [id number]    Blood Component Type THAWED PLASMA    Unit division 00    Status of Unit ISSUED    Transfusion Status OK TO TRANSFUSE    Unit Number [id number]    Blood Component Type THAWED PLASMA    Unit division 00    Status of Unit ISSUED    Transfusion Status OK TO TRANSFUSE   Basic metabolic panel     Status: Abnormal   Collection Time: 05/22/15 10:52 PM  Result Value Ref Range   Sodium 134 (L) 135 - 145 mmol/L   Potassium 4.2 3.5 - 5.1 mmol/L   Chloride 105 101 - 111 mmol/L   CO2 25 22 - 32 mmol/L   Glucose, Bld 130 (H) 65 - 99 mg/dL   BUN 9 6 - 20 mg/dL   Creatinine, Ser 5.78 0.61 - 1.24 mg/dL   Calcium 7.2 (L) 8.9 - 10.3 mg/dL   GFR calc non Af Amer >60 >60 mL/min   GFR calc Af Amer >60 >60 mL/min   Anion gap 4 (L) 5 - 15  Protime-INR     Status: Abnormal   Collection Time: 05/22/15 10:52 PM  Result Value Ref Range   Prothrombin Time 18.1 (H) 11.6 - 15.2 seconds   INR 1.50 (H) 0.00 - 1.49  CBC     Status: Abnormal   Collection Time: 05/23/15  3:23 AM  Result Value Ref Range   WBC 8.6 4.0 - 10.5 K/uL   RBC 2.88 (L) 4.22 - 5.81 MIL/uL   Hemoglobin 9.0 (L) 13.0 - 17.0 g/dL   HCT 46.9 (L) 62.9 - 52.8 %   MCV 89.9 78.0 - 100.0 fL   MCH 31.3 26.0 - 34.0 pg   MCHC 34.7 30.0 - 36.0 g/dL   RDW 41.3 24.4 - 01.0 %   Platelets 96 (L) 150 - 400 K/uL  Basic metabolic panel     Status: Abnormal   Collection Time: 05/23/15   3:23 AM  Result Value Ref Range   Sodium 137 135 - 145 mmol/L   Potassium 4.3 3.5 - 5.1 mmol/L   Chloride 105 101 - 111 mmol/L   CO2 26 22 - 32 mmol/L   Glucose, Bld 126 (H) 65 - 99 mg/dL   BUN 8 6 - 20 mg/dL   Creatinine, Ser 2.72 0.61 - 1.24 mg/dL   Calcium 7.3 (L) 8.9 - 10.3 mg/dL   GFR calc non Af Amer >60 >60 mL/min   GFR calc Af Amer >60 >60 mL/min   Anion gap 6 5 - 15     Assessment/Plan:   NEURO  Altered Mental Status:  sedation   Plan: Wean sedation as we try to wean the patient from the ventilator  PULM  No specific pulmonary issues.   Plan: CPM  CARDIO  No issues   Plan: CPM  RENAL  Oliguria (probably hypovolemia)   Plan: Bolus with albumin this AM, may need more blood later.  GI  Bowel Trauma with SB resection.   Plan: Keep NGT after extubation.  ID  No known infectious source   Plan: Keep on perioperative antibiotic only  HEME  Anemia acute blood loss anemia) Thrombocytopenia (etiology unknown)   Plan: Replace as needed.  ENDO No known issues.   Plan: CPM  Global Issues  Still dropping hemoglobin and needing pressors.  May need more blood later today, but will move towards extubation     LOS: 1 day  Additional comments:I reviewed the patient's new clinical lab test results. cbc/bmet and I reviewed the patients new imaging test results. cxr  Critical Care Total Time*: 30 Minutes  Agustina Witzke 05/23/2015  *Care during the described time interval was provided by me and/or other providers on the critical care team.  I have reviewed this patient's available data, including medical history, events of note, physical examination and test results as part of my evaluation.
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
## 2015-05-24 ENCOUNTER — Encounter (HOSPITAL_COMMUNITY): Payer: Self-pay | Admitting: *Deleted

## 2015-05-24 DIAGNOSIS — W3400XA Accidental discharge from unspecified firearms or gun, initial encounter: Secondary | ICD-10-CM

## 2015-05-24 DIAGNOSIS — S31803A Puncture wound without foreign body of unspecified buttock, initial encounter: Secondary | ICD-10-CM

## 2015-05-24 DIAGNOSIS — D696 Thrombocytopenia, unspecified: Secondary | ICD-10-CM | POA: Diagnosis not present

## 2015-05-24 DIAGNOSIS — D62 Acute posthemorrhagic anemia: Secondary | ICD-10-CM | POA: Diagnosis not present

## 2015-05-24 LAB — BASIC METABOLIC PANEL
ANION GAP: 4 — AB (ref 5–15)
BUN: <5 mg/dL — ABNORMAL LOW (ref 6–20)
CALCIUM: 7.4 mg/dL — AB (ref 8.9–10.3)
CHLORIDE: 102 mmol/L (ref 101–111)
CO2: 25 mmol/L (ref 22–32)
CREATININE: 0.8 mg/dL (ref 0.61–1.24)
GFR calc Af Amer: >60 mL/min (ref >60)
GFR calc non Af Amer: >60 mL/min (ref >60)
GLUCOSE: 96 mg/dL (ref 65–99)
Potassium: 3.8 mmol/L (ref 3.5–5.1)
Sodium: 131 mmol/L — ABNORMAL LOW (ref 135–145)

## 2015-05-24 LAB — CBC
HEMATOCRIT: 23.9 % — AB (ref 39.0–52.0)
HEMOGLOBIN: 8.2 g/dL — AB (ref 13.0–17.0)
MCH: 30.6 pg (ref 26.0–34.0)
MCHC: 34.3 g/dL (ref 30.0–36.0)
MCV: 89.2 fL (ref 78.0–100.0)
Platelets: 77 10*3/uL — ABNORMAL LOW (ref 150–400)
RBC: 2.68 MIL/uL — ABNORMAL LOW (ref 4.22–5.81)
RDW: 14.2 % (ref 11.5–15.5)
WBC: 11.1 10*3/uL — ABNORMAL HIGH (ref 4.0–10.5)

## 2015-05-24 LAB — TYPE AND SCREEN
ABO/RH(D): O POS
ANTIBODY SCREEN: NEGATIVE
UNIT DIVISION: 0
UNIT DIVISION: 0
UNIT DIVISION: 0
UNIT DIVISION: 0
UNIT DIVISION: 0
Unit division: 0
Unit division: 0
Unit division: 0
Unit division: 0
Unit division: 0
Unit division: 0
Unit division: 0

## 2015-05-24 MED ORDER — ONDANSETRON HCL 4 MG/2ML IJ SOLN
4.0000 mg | Freq: Four times a day (QID) | INTRAMUSCULAR | Status: DC | PRN
  Administered 2015-05-25: 4 mg via INTRAVENOUS
  Filled 2015-05-24: qty 2

## 2015-05-24 MED ORDER — SODIUM CHLORIDE 0.9 % IJ SOLN: 9.0000 mL | INTRAMUSCULAR | Status: DC | PRN

## 2015-05-24 MED ORDER — DIPHENHYDRAMINE HCL 50 MG/ML IJ SOLN: 12.5000 mg | Freq: Four times a day (QID) | INTRAMUSCULAR | Status: DC | PRN

## 2015-05-24 MED ORDER — DIPHENHYDRAMINE HCL 12.5 MG/5ML PO ELIX
12.5000 mg | ORAL_SOLUTION | Freq: Four times a day (QID) | ORAL | Status: DC | PRN
  Filled 2015-05-24: qty 5

## 2015-05-24 MED ORDER — NALOXONE HCL 0.4 MG/ML IJ SOLN: 0.4000 mg | INTRAMUSCULAR | Status: DC | PRN

## 2015-05-24 MED ORDER — HYDROMORPHONE 0.3 MG/ML IV SOLN
INTRAVENOUS | Status: DC
  Administered 2015-05-24: 09:00:00 via INTRAVENOUS
  Administered 2015-05-24: 1.2 mg via INTRAVENOUS
  Administered 2015-05-25 (×2): 0 mg via INTRAVENOUS
  Filled 2015-05-24: qty 25
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
## 2015-05-24 NOTE — Progress Notes (Signed)
Transferred to 6N via wheelchair. No changes.

## 2015-05-24 NOTE — Progress Notes (Signed)
[place] [place] [facility]. Oriented to the unit routine.Keep monitoring pt. Closely.

## 2015-05-25 LAB — CBC
HEMATOCRIT: 23 % — AB (ref 39.0–52.0)
HEMOGLOBIN: 8.1 g/dL — AB (ref 13.0–17.0)
MCH: 31.5 pg (ref 26.0–34.0)
MCHC: 35.2 g/dL (ref 30.0–36.0)
MCV: 89.5 fL (ref 78.0–100.0)
Platelets: 96 10*3/uL — ABNORMAL LOW (ref 150–400)
RBC: 2.57 MIL/uL — ABNORMAL LOW (ref 4.22–5.81)
RDW: 13.9 % (ref 11.5–15.5)
WBC: 10.1 10*3/uL (ref 4.0–10.5)

## 2015-05-25 MED ORDER — HYDROMORPHONE HCL 1 MG/ML IJ SOLN: 1.0000 mg | INTRAMUSCULAR | Status: DC | PRN

## 2015-05-25 MED ORDER — OXYCODONE HCL 5 MG PO TABS
5.0000 mg | ORAL_TABLET | ORAL | Status: DC | PRN
  Administered 2015-05-25 – 2015-05-26 (×3): 10 mg via ORAL
  Administered 2015-05-26: 15 mg via ORAL
  Administered 2015-05-27 (×3): 10 mg via ORAL
  Filled 2015-05-25 (×4): qty 2
  Filled 2015-05-25: qty 3
  Filled 2015-05-25 (×2): qty 2
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
## 2015-05-26 LAB — CBC
HCT: 21.8 % — ABNORMAL LOW (ref 39.0–52.0)
Hemoglobin: 7.5 g/dL — ABNORMAL LOW (ref 13.0–17.0)
MCH: 30.9 pg (ref 26.0–34.0)
MCHC: 34.4 g/dL (ref 30.0–36.0)
MCV: 89.7 fL (ref 78.0–100.0)
PLATELETS: 126 10*3/uL — AB (ref 150–400)
RBC: 2.43 MIL/uL — AB (ref 4.22–5.81)
RDW: 13.6 % (ref 11.5–15.5)
WBC: 8.8 10*3/uL (ref 4.0–10.5)

## 2015-05-26 MED ORDER — SODIUM CHLORIDE 0.9 % IJ SOLN: 3.0000 mL | INTRAMUSCULAR | Status: DC | PRN

## 2015-05-26 MED ORDER — SODIUM CHLORIDE 0.9 % IV SOLN: 250.0000 mL | INTRAVENOUS | Status: DC | PRN

## 2015-05-26 MED ORDER — LACTATED RINGERS IV BOLUS (SEPSIS): 1000.0000 mL | Freq: Three times a day (TID) | INTRAVENOUS | Status: DC | PRN

## 2015-05-26 MED ORDER — SODIUM CHLORIDE 0.9 % IJ SOLN: 3.0000 mL | Freq: Two times a day (BID) | INTRAMUSCULAR | Status: DC

## 2015-05-26 MED ORDER — ENOXAPARIN SODIUM 40 MG/0.4ML ~~LOC~~ SOLN
40.0000 mg | SUBCUTANEOUS | Status: DC
  Administered 2015-05-26 – 2015-05-27 (×2): 40 mg via SUBCUTANEOUS
  Filled 2015-05-26 (×2): qty 0.4
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
## 2015-05-27 MED ORDER — OXYCODONE HCL 5 MG PO TABS: 5.0000 mg | ORAL_TABLET | ORAL | Status: DC | PRN
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
## 2015-05-27 NOTE — Discharge Instructions (Signed)
[facility]      [facility] [phone]  OPEN ABDOMINAL SURGERY: POST OP INSTRUCTIONS  Always review your discharge instruction sheet given to you by the facility where your surgery was performed.  IF YOU HAVE DISABILITY OR FAMILY LEAVE FORMS, YOU MUST BRING THEM TO THE OFFICE FOR PROCESSING.  PLEASE DO NOT GIVE THEM TO YOUR DOCTOR.  1. A prescription for pain medication may be given to you upon discharge.  Take your pain medication as prescribed, if needed.  If narcotic pain medicine is not needed, then you may take acetaminophen (Tylenol) or ibuprofen (Advil) as needed. 2. Take your usually prescribed medications unless otherwise directed. 3. If you need a refill on your pain medication, please contact your pharmacy. They will contact our office to request authorization.  Prescriptions will not be filled after 5pm or on week-ends. 4. You should follow a light diet the first few days after arrival home, such as soup and crackers, pudding, etc.unless your doctor has advised otherwise. A high-fiber, low fat diet can be resumed as tolerated.   Be sure to include lots of fluids daily. Most patients will experience some swelling and bruising on the chest and neck area.  Ice packs will help.  Swelling and bruising can take several days to resolve 5. Most patients will experience some swelling and bruising in the area of the incision. Ice pack will help. Swelling and bruising can take several days to resolve..  6. It is common to experience some constipation if taking pain medication after surgery.  Increasing fluid intake and taking a stool softener will usually help or prevent this problem from occurring.  A mild laxative (Milk of Magnesia or Miralax) should be taken according to package directions if there are no bowel movements after 48 hours. 7.  You may have steri-strips (small skin tapes) in place directly over the incision.  These strips should be left on the skin for 7-10 days.  If your  surgeon used skin glue on the incision, you may shower in 24 hours.  The glue will flake off over the next 2-3 weeks.  Any sutures or staples will be removed at the office during your follow-up visit. You may find that a light gauze bandage over your incision may keep your staples from being rubbed or pulled. You may shower and replace the bandage daily. 8. ACTIVITIES:  You may resume regular (light) daily activities beginning the next day--such as daily self-care, walking, climbing stairs--gradually increasing activities as tolerated.  You may have sexual intercourse when it is comfortable.  Refrain from any heavy lifting or straining until approved by your doctor. a. You may drive when you no longer are taking prescription pain medication, you can comfortably wear a seatbelt, and you can safely maneuver your car and apply brakes b. Return to Work: ___________________________________ 9. You should see your doctor in the office for a follow-up appointment approximately two weeks after your surgery.  Make sure that you call for this appointment within a day or two after you arrive home to insure a convenient appointment time. OTHER INSTRUCTIONS:  _____________________________________________________________ _____________________________________________________________  WHEN TO CALL YOUR DOCTOR: 1. Fever over 101.0 2. Inability to urinate 3. Nausea and/or vomiting 4. Extreme swelling or bruising 5. Continued bleeding from incision. 6. Increased pain, redness, or drainage from the incision. 7. Difficulty swallowing or breathing 8. Muscle cramping or spasms. 9. Numbness or tingling in hands or feet or around lips.  The clinic staff is available to   answer your questions during regular business hours.  Please don't hesitate to call and ask to speak to one of the nurses if you have concerns.  For further questions, please visit [email or web address]   
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
## 2015-05-28 ENCOUNTER — Encounter (HOSPITAL_COMMUNITY): Payer: Self-pay | Admitting: Orthopedic Surgery

## 2015-06-11 ENCOUNTER — Telehealth (HOSPITAL_COMMUNITY): Payer: Self-pay | Admitting: Orthopedic Surgery

## 2015-06-12 MED ORDER — OXYCODONE-ACETAMINOPHEN 5-325 MG PO TABS: 1.0000 | ORAL_TABLET | ORAL | Status: DC | PRN

## 2015-06-12 NOTE — Telephone Encounter (Signed)
Dr. Janee Mornhompson authorized one more refill of Perc 5/325, #20.

## 2015-09-03 ENCOUNTER — Emergency Department (HOSPITAL_COMMUNITY)
Admission: EM | Admit: 2015-09-03 | Discharge: 2015-09-03 | Disposition: A | Discharge status: Home / Self Care | Payer: No Typology Code available for payment source | Attending: Emergency Medicine | Admitting: Emergency Medicine

## 2015-09-03 ENCOUNTER — Emergency Department (HOSPITAL_COMMUNITY): Payer: No Typology Code available for payment source

## 2015-09-03 ENCOUNTER — Encounter (HOSPITAL_COMMUNITY): Payer: Self-pay | Admitting: Emergency Medicine

## 2015-09-03 DIAGNOSIS — Y9389 Activity, other specified: Secondary | ICD-10-CM | POA: Diagnosis not present

## 2015-09-03 DIAGNOSIS — S80211A Abrasion, right knee, initial encounter: Secondary | ICD-10-CM | POA: Insufficient documentation

## 2015-09-03 DIAGNOSIS — S060X0A Concussion without loss of consciousness, initial encounter: Secondary | ICD-10-CM | POA: Insufficient documentation

## 2015-09-03 DIAGNOSIS — Y92410 Unspecified street and highway as the place of occurrence of the external cause: Secondary | ICD-10-CM | POA: Diagnosis not present

## 2015-09-03 DIAGNOSIS — S80212A Abrasion, left knee, initial encounter: Secondary | ICD-10-CM | POA: Diagnosis not present

## 2015-09-03 DIAGNOSIS — S00511A Abrasion of lip, initial encounter: Secondary | ICD-10-CM | POA: Insufficient documentation

## 2015-09-03 DIAGNOSIS — F17200 Nicotine dependence, unspecified, uncomplicated: Secondary | ICD-10-CM | POA: Insufficient documentation

## 2015-09-03 DIAGNOSIS — S50812A Abrasion of left forearm, initial encounter: Secondary | ICD-10-CM | POA: Diagnosis not present

## 2015-09-03 DIAGNOSIS — S0990XA Unspecified injury of head, initial encounter: Secondary | ICD-10-CM | POA: Diagnosis present

## 2015-09-03 DIAGNOSIS — Y998 Other external cause status: Secondary | ICD-10-CM | POA: Diagnosis not present

## 2015-09-03 DIAGNOSIS — T148XXA Other injury of unspecified body region, initial encounter: Secondary | ICD-10-CM

## 2015-09-03 HISTORY — DX: Accidental discharge from unspecified firearms or gun, initial encounter: W34.00XA

## 2015-09-03 LAB — CBC WITH DIFFERENTIAL/PLATELET
BASOS PCT: 1 %
Basophils Absolute: 0.1 10*3/uL (ref 0.0–0.1)
EOS ABS: 0.2 10*3/uL (ref 0.0–0.7)
Eosinophils Relative: 4 %
HCT: 42.6 % (ref 39.0–52.0)
HEMOGLOBIN: 13.5 g/dL (ref 13.0–17.0)
Lymphocytes Relative: 40 %
Lymphs Abs: 2.3 10*3/uL (ref 0.7–4.0)
MCH: 28.1 pg (ref 26.0–34.0)
MCHC: 31.7 g/dL (ref 30.0–36.0)
MCV: 88.6 fL (ref 78.0–100.0)
Monocytes Absolute: 0.4 10*3/uL (ref 0.1–1.0)
Monocytes Relative: 7 %
NEUTROS PCT: 48 %
Neutro Abs: 2.7 10*3/uL (ref 1.7–7.7)
Platelets: 215 10*3/uL (ref 150–400)
RBC: 4.81 MIL/uL (ref 4.22–5.81)
RDW: 15.5 % (ref 11.5–15.5)
WBC: 5.7 10*3/uL (ref 4.0–10.5)

## 2015-09-03 LAB — BASIC METABOLIC PANEL
Anion gap: 11 (ref 5–15)
BUN: 10 mg/dL (ref 6–20)
CHLORIDE: 107 mmol/L (ref 101–111)
CO2: 24 mmol/L (ref 22–32)
CREATININE: 0.9 mg/dL (ref 0.61–1.24)
Calcium: 9.1 mg/dL (ref 8.9–10.3)
GFR calc non Af Amer: >60 mL/min (ref >60)
Glucose, Bld: 94 mg/dL (ref 65–99)
Potassium: 3.8 mmol/L (ref 3.5–5.1)
SODIUM: 142 mmol/L (ref 135–145)

## 2015-09-03 MED ORDER — ONDANSETRON HCL 4 MG/2ML IJ SOLN
4.0000 mg | Freq: Once | INTRAMUSCULAR | Status: AC
Start: 2015-09-03 — End: 2015-09-03
  Administered 2015-09-03: 4 mg via INTRAVENOUS
  Filled 2015-09-03: qty 2

## 2015-09-03 MED ORDER — MORPHINE SULFATE (PF) 4 MG/ML IV SOLN
4.0000 mg | INTRAVENOUS | Status: DC | PRN
  Administered 2015-09-03: 4 mg via INTRAVENOUS
  Filled 2015-09-03: qty 1

## 2015-09-03 MED ORDER — IOHEXOL 300 MG/ML  SOLN
100.0000 mL | Freq: Once | INTRAMUSCULAR | Status: AC | PRN
Start: 2015-09-03 — End: 2015-09-03
  Administered 2015-09-03: 100 mL via INTRAVENOUS

## 2015-09-03 MED ORDER — BACITRACIN ZINC 500 UNIT/GM EX OINT
TOPICAL_OINTMENT | Freq: Once | CUTANEOUS | Status: AC
  Administered 2015-09-03: 1 via TOPICAL

## 2015-09-03 MED ORDER — HYDROCODONE-ACETAMINOPHEN 5-325 MG PO TABS: ORAL_TABLET | ORAL | Status: DC
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
## 2015-09-03 NOTE — Discharge Instructions (Signed)
For pain control please take ibuprofen (also known as Motrin or Advil) 800mg  (this is normally 4 over the counter pills) 3 times a day  for 5 days. Take with food to minimize stomach irritation.  Take vicodin for breakthrough pain, do not drink alcohol, drive, care for children or do other critical tasks while taking vicodin.  Wash the affected area with soap and water and apply a thin layer of topical antibiotic ointment. Do this every 12 hours.   Do not use rubbing alcohol or hydrogen peroxide.                        Look for signs of infection: if you see redness, if the area becomes warm, if pain increases sharply, there is discharge (pus), if red streaks appear or you develop fever or vomiting, RETURN immediately to the Emergency Department  for a recheck.    Do not hesitate to return to the emergency room for any new, worsening or concerning symptoms.  Please obtain primary care using resource guide below. Let them know that you were seen in the emergency room and that they will need to obtain records for further outpatient management.    Concussion, Adult A concussion, or closed-head injury, is a brain injury caused by a direct blow to the head or by a quick and sudden movement (jolt) of the head or neck. Concussions are usually not life-threatening. Even so, the effects of a concussion can be serious. If you have had a concussion before, you are more likely to experience concussion-like symptoms after a direct blow to the head.  CAUSES  Direct blow to the head, such as from running into another player during a soccer game, being hit in a fight, or hitting your head on a hard surface.  A jolt of the head or neck that causes the brain to move back and forth inside the skull, such as in a car crash. SIGNS AND SYMPTOMS The signs of a concussion can be hard to notice. Early on, they may be missed by you, family members, and health care providers. You may look fine but act or feel  differently. Symptoms are usually temporary, but they may last for days, weeks, or even longer. Some symptoms may appear right away while others may not show up for hours or days. Every head injury is different. Symptoms include:  Mild to moderate headaches that will not go away.  A feeling of pressure inside your head.  Having more trouble than usual:  Learning or remembering things you have heard.  Answering questions.  Paying attention or concentrating.  Organizing daily tasks.  Making decisions and solving problems.  Slowness in thinking, acting or reacting, speaking, or reading.  Getting lost or being easily confused.  Feeling tired all the time or lacking energy (fatigued).  Feeling drowsy.  Sleep disturbances.  Sleeping more than usual.  Sleeping less than usual.  Trouble falling asleep.  Trouble sleeping (insomnia).  Loss of balance or feeling lightheaded or dizzy.  Nausea or vomiting.  Numbness or tingling.  Increased sensitivity to:  Sounds.  Lights.  Distractions.  Vision problems or eyes that tire easily.  Diminished sense of taste or smell.  Ringing in the ears.  Mood changes such as feeling sad or anxious.  Becoming easily irritated or angry for little or no reason.  Lack of motivation.  Seeing or hearing things other people do not see or hear (hallucinations). DIAGNOSIS Your health care  provider can usually diagnose a concussion based on a description of your injury and symptoms. He or she will ask whether you passed out (lost consciousness) and whether you are having trouble remembering events that happened right before and during your injury. Your evaluation might include:  A brain scan to look for signs of injury to the brain. Even if the test shows no injury, you may still have a concussion.  Blood tests to be sure other problems are not present. TREATMENT  Concussions are usually treated in an emergency department, in urgent  care, or at a clinic. You may need to stay in the hospital overnight for further treatment.  Tell your health care provider if you are taking any medicines, including prescription medicines, over-the-counter medicines, and natural remedies. Some medicines, such as blood thinners (anticoagulants) and aspirin, may increase the chance of complications. Also tell your health care provider whether you have had alcohol or are taking illegal drugs. This information may affect treatment.  Your health care provider will send you home with important instructions to follow.  How fast you will recover from a concussion depends on many factors. These factors include how severe your concussion is, what part of your brain was injured, your age, and how healthy you were before the concussion.  Most people with mild injuries recover fully. Recovery can take time. In general, recovery is slower in older persons. Also, persons who have had a concussion in the past or have other medical problems may find that it takes longer to recover from their current injury. HOME CARE INSTRUCTIONS General Instructions  Carefully follow the directions your health care provider gave you.  Only take over-the-counter or prescription medicines for pain, discomfort, or fever as directed by your health care provider.  Take only those medicines that your health care provider has approved.  Do not drink alcohol until your health care provider says you are well enough to do so. Alcohol and certain other drugs may slow your recovery and can put you at risk of further injury.  If it is harder than usual to remember things, write them down.  If you are easily distracted, try to do one thing at a time. For example, do not try to watch TV while fixing dinner.  Talk with family members or close friends when making important decisions.  Keep all follow-up appointments. Repeated evaluation of your symptoms is recommended for your  recovery.  Watch your symptoms and tell others to do the same. Complications sometimes occur after a concussion. Older adults with a brain injury may have a higher risk of serious complications, such as a blood clot on the brain.  Tell your teachers, school nurse, school counselor, coach, athletic trainer, or work Production designer, theatre/television/film about your injury, symptoms, and restrictions. Tell them about what you can or cannot do. They should watch for:  Increased problems with attention or concentration.  Increased difficulty remembering or learning new information.  Increased time needed to complete tasks or assignments.  Increased irritability or decreased ability to cope with stress.  Increased symptoms.  Rest. Rest helps the brain to heal. Make sure you:  Get plenty of sleep at night. Avoid staying up late at night.  Keep the same bedtime hours on weekends and weekdays.  Rest during the day. Take daytime naps or rest breaks when you feel tired.  Limit activities that require a lot of thought or concentration. These include:  Doing homework or job-related work.  Watching TV.  Working on  the computer.  Avoid any situation where there is potential for another head injury (football, hockey, soccer, basketball, martial arts, downhill snow sports and horseback riding). Your condition will get worse every time you experience a concussion. You should avoid these activities until you are evaluated by the appropriate follow-up health care providers. Returning To Your Regular Activities You will need to return to your normal activities slowly, not all at once. You must give your body and brain enough time for recovery.  Do not return to sports or other athletic activities until your health care provider tells you it is safe to do so.  Ask your health care provider when you can drive, ride a bicycle, or operate heavy machinery. Your ability to react may be slower after a brain injury. Never do these  activities if you are dizzy.  Ask your health care provider about when you can return to work or school. Preventing Another Concussion It is very important to avoid another brain injury, especially before you have recovered. In rare cases, another injury can lead to permanent brain damage, brain swelling, or death. The risk of this is greatest during the first 7-10 days after a head injury. Avoid injuries by:  Wearing a seat belt when riding in a car.  Drinking alcohol only in moderation.  Wearing a helmet when biking, skiing, skateboarding, skating, or doing similar activities.  Avoiding activities that could lead to a second concussion, such as contact or recreational sports, until your health care provider says it is okay.  Taking safety measures in your home.  Remove clutter and tripping hazards from floors and stairways.  Use grab bars in bathrooms and handrails by stairs.  Place non-slip mats on floors and in bathtubs.  Improve lighting in dim areas. SEEK MEDICAL CARE IF:  You have increased problems paying attention or concentrating.  You have increased difficulty remembering or learning new information.  You need more time to complete tasks or assignments than before.  You have increased irritability or decreased ability to cope with stress.  You have more symptoms than before. Seek medical care if you have any of the following symptoms for more than 2 weeks after your injury:  Lasting (chronic) headaches.  Dizziness or balance problems.  Nausea.  Vision problems.  Increased sensitivity to noise or light.  Depression or mood swings.  Anxiety or irritability.  Memory problems.  Difficulty concentrating or paying attention.  Sleep problems.  Feeling tired all the time. SEEK IMMEDIATE MEDICAL CARE IF:  You have severe or worsening headaches. These may be a sign of a blood clot in the brain.  You have weakness (even if only in one hand, leg, or part of  the face).  You have numbness.  You have decreased coordination.  You vomit repeatedly.  You have increased sleepiness.  One pupil is larger than the other.  You have convulsions.  You have slurred speech.  You have increased confusion. This may be a sign of a blood clot in the brain.  You have increased restlessness, agitation, or irritability.  You are unable to recognize people or places.  You have neck pain.  It is difficult to wake you up.  You have unusual behavior changes.  You lose consciousness. MAKE SURE YOU:  Understand these instructions.  Will watch your condition.  Will get help right away if you are not doing well or get worse.   This information is not intended to replace advice given to you by your  health care provider. Make sure you discuss any questions you have with your health care provider.   Document Released: 10/17/2003 Document Revised: 08/17/2014 Document Reviewed: 02/16/2013 Elsevier Interactive Patient Education 2016 [employer].    Emergency Department Resource Guide 1) Find a Doctor and Pay Out of Pocket Although you won't have to find out who is covered by your insurance plan, it is a good idea to ask around and get recommendations. You will then need to call the office and see if the doctor you have chosen will accept you as a new patient and what types of options they offer for patients who are self-pay. Some doctors offer discounts or will set up payment plans for their patients who do not have insurance, but you will need to ask so you aren't surprised when you get to your appointment.  2) Contact Your Local Health Department Not all health departments have doctors that can see patients for sick visits, but many do, so it is worth a call to see if yours does. If you don't know where your local health department is, you can check in your phone book. The CDC also has a tool to help you locate your state's health department, and many  state websites also have listings of all of their local health departments.  3) Find a Walk-in Clinic If your illness is not likely to be very severe or complicated, you may want to try a walk in clinic. These are popping up all over the country in pharmacies, drugstores, and shopping centers. They're usually staffed by nurse practitioners or physician assistants that have been trained to treat common illnesses and complaints. They're usually fairly quick and inexpensive. However, if you have serious medical issues or chronic medical problems, these are probably not your best option.  No Primary Care Doctor: - Call Health Connect at  [phone] - they can help you locate a primary care doctor that  accepts your insurance, provides certain services, etc. - Physician Referral Service- [phone]  Chronic Pain Problems: Organization         Address  Phone   Notes  [facility]  [phone] Patients need to be referred by their primary care doctor.   Medication Assistance: Organization         Address  Phone   Notes  [facility] Medication [facility] [address] [place], [place] [address] [phone] --Must be a resident of [facility] -- Must have NO insurance coverage whatsoever (no Medicaid/ Medicare, etc.) -- The pt. MUST have a primary care doctor that directs their care regularly and follows them in the community   [facility]  [phone]   [employer]  [phone]    Agencies that provide inexpensive medical care: Organization         Address  Phone   Notes  [facility]  [phone]   [facility]    [phone]   [facility] [address] [place], [place] [address] [phone]   [facility] [address] [place] [place] [phone]   Planned Parenthood    [phone]   [facility]    [phone]   Community Health and  [facility]  [address], [place] Phone:  [phone], Fax:  [phone] Hours of Operation:  9 am - 6 pm, M-F.  Also accepts Medicaid/Medicare and self-pay.  [facility]  [address], [place] Phone: [phone], Fax: [phone]. Hours of Operation:  8:30 am - 5:30 pm, M-F.  Also accepts Medicaid and self-pay.  [facility] [place] [address], [place] Phone: [phone]   [facility] [address] Natasha Bence [place], [place] [phone], Ext. [phone] Mondays & Thursdays: 7-9 AM.  First 15 patients are seen on a first come, first serve basis.    Medicaid-accepting [facility] Providers:  Organization         Address  Phone   Notes  [facility] [address],  [phone] Also accepts self-pay patients.  [facility] [address] Laurell Josephs [place], [place]  [phone]   [facility] [address], [place] [phone]   [facility] [address] [place] [phone]   Renaye Rakers [address], [place]   [phone] Only accepts [place] Access [place] patients after they have their name applied to their card.   Self-Pay (no insurance) in [facility]:  Organization         Address  Phone   Notes  Sickle Cell Patients, [facility] [address] [place], [place] [phone]   [facility] [address] [place], [place] [phone]   [facility]  [address],  [phone]   [facility]/Dr. Osei-Bonsu  [address] [place] or [address], [place] [phone] Phone number for both [place] and [place] locations is the same.  Urgent Medical and [facility] [address], [place] [phone]   [facility] [address] [place] or [address] [phone]   [facility] [address] [place] [phone], phone; [phone], fax Sees patients 1st and 3rd Saturday of every month.  Must not qualify for public or private insurance (i.e. Medicaid, Medicare, [place] Health Choice, Veterans' Benefits)  Household income should be no more than 200% of the poverty level The clinic cannot treat you if you are pregnant or think you are pregnant  Sexually transmitted diseases are not treated at the clinic.    Dental Care: Organization         Address  Phone  Notes  [facility] Department of [facility] [address] [place], [place] [phone] Accepts children up to age 2 who are enrolled in [place] or [place] Health Choice; pregnant women with a Medicaid card; and children who have applied for Medicaid or [place] Health Choice, but were declined, whose parents can pay a reduced fee at time of service.  [facility] Department of [facility]  [address], [place] [phone] Accepts children up to age 53 who are enrolled in [place] or [place] Health Choice; pregnant women with a Medicaid card; and children who have applied for Medicaid or [place] Health Choice, but were declined, whose parents can pay a reduced fee at time of service.  [facility]  [address] [place], [place] [phone] Patients are seen by appointment only. Walk-ins are not accepted. [facility] will see patients 11 years of age and older. Monday - Tuesday (8am-5pm) Most Wednesdays (8:30-5pm) $30 per visit, cash only  [facility]  [address], [facility] [phone] Patients are seen by appointment only. Walk-ins are not accepted. [facility] will see patients 26 years of age and older. One Wednesday Evening (Monthly: Volunteer Based).  $30 per visit, cash only  [employer]  [phone] for adults; Children under age 13, call Graduate Pediatric Dentistry at [phone]. Children aged 96-14, please call [phone] to request a pediatric application.  Dental services are provided in all areas of dental care including fillings, crowns and bridges, complete and partial dentures, implants, gum treatment, root canals, and extractions. Preventive care is also provided. Treatment is provided to both adults and children. Patients are selected via a lottery and there is often a waiting list.   [facility] [address], [place]  [phone] [email or web address]   [facility] [address] [place], [place] [phone], Ext. [phone] Second and Fourth Thursday of each month, opens at 6:30 AM; Clinic ends at 9 AM.  Patients are seen on a first-come first-served basis, and a limited number are seen during each clinic.   [facility]  [address] [place], [place] [phone]   Eligibility Requirements You must have lived in [place], [place], or [place] counties for at least the last three months.   You cannot be eligible for state or federal sponsored [place], including [employer], [place], or [employer].   You generally cannot be eligible for healthcare insurance through your employer.    How to apply: Eligibility screenings are held every Tuesday and Wednesday afternoon from 1:00 pm until 4:00 pm. You do not need an appointment for the interview!  [facility] [address] [place], [place] [phone]   [facility] Health Department  [phone]   [facility] Health Department  [phone]   [facility] Health Department  [phone]    Behavioral Health Resources in the Community: Intensive Outpatient Programs Organization         Address  Phone  Notes  [facility] [address] [place], [place]  [phone]   [facility] Outpatient [address] [place], [place] [phone]   [facility] [address] [place], [place]  [phone]   [facility] [address],  [place], [place] [phone] or [phone]   Substance Abuse Resources Organization         Address  Phone  Notes  [facility]  [phone]   [facility]  [phone]   [facility]  [phone]   Floydene Flock  [phone]   Residential & Outpatient Substance Abuse Program  [phone]   Psychological Services Organization         Address  Phone  Notes  [facility]  [phone]   [facility]  [phone]   [facility] Mental Health [address], [place] [phone] or [phone]    Mobile Crisis Teams Organization         Address  Phone  Notes  [facility], Mobile Crisis Care Unit  [phone]   [facility]  [address] [place], [place] [phone]   Doristine Locks [address] [place] [phone]    Self-Help/Support Groups Organization         Address  Phone             Notes  [facility] [place] - variety of support groups  336- I7437963 Call for more information  Narcotics Anonymous (NA), [facility] [address], [employer] [place]  2 meetings at this location   Statistician         Address  Phone  Notes  [facility] [address],    [place]  [phone]   [facility]  [address], [place] [address], [place], [place] [phone]   [facility] [address] [place], [place] [phone] Admissions: 8am-3pm M-F  [facility] [address],    [place], [place] [phone]   [facility] [address] [place], [place], [place] [phone]   [facility] [address]   [place], [place] [phone]   [facility] - Intensive Outpatient [address] Laurell Josephs [address], [place], [place] [phone]   [facility] ([facility] [address] [place].,  [place], [place] [phone] or [phone]   [facility] ([facility]) [address], [place], [place] [phone] Accepts Medicaid  [facility] [address]  [place] [phone] Substance Abuse/Addiction Treatment   [facility] Organization         Address  Phone  Notes  [facility]  [phone]   Angie Fava, PhD [address] Ervin Knack [place], [place]   [phone] or [phone]   [facility] Behavioral   [address] [place], [place] [phone]   [facility] [address] [place], [place] [phone] Insurance/Medicaid/sponsorship through [facility] and Families [address]                                    [place], [place] [phone] Therapy/tele-psych/case  [facility] [address], [place] [phone]    Dr. Lolly Mustache  [phone]   [facility]  [employer] [facility] Dept. 1) [address] [place] 2) [address], [place] 3)  [address], [place] [phone]  [phone]   [facility] Child Abuse Hotline 587-019-9870 or [phone] (After Hours)
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
## 2015-09-03 NOTE — ED Notes (Signed)
Pt verbalizes understaning of instructions

## 2015-09-03 NOTE — ED Notes (Signed)
Wounds cleaned and dressed.

## 2015-09-03 NOTE — ED Notes (Signed)
Called CT will transport shortly to CT then will be transported to xray.

## 2015-09-03 NOTE — ED Notes (Signed)
MD at bedside.C-collar removed. Pt speaking on phone.

## 2015-09-04 ENCOUNTER — Encounter (HOSPITAL_COMMUNITY): Payer: Self-pay | Admitting: Orthopedic Surgery

## 2016-06-20 ENCOUNTER — Ambulatory Visit (HOSPITAL_COMMUNITY)
Admission: EM | Admit: 2016-06-20 | Discharge: 2016-06-20 | Disposition: A | Discharge status: Home / Self Care | Payer: 59 | Attending: Family Medicine | Admitting: Family Medicine

## 2016-06-20 ENCOUNTER — Encounter (HOSPITAL_COMMUNITY): Payer: Self-pay | Admitting: Emergency Medicine

## 2016-06-20 DIAGNOSIS — H6122 Impacted cerumen, left ear: Secondary | ICD-10-CM

## 2016-06-20 DIAGNOSIS — J209 Acute bronchitis, unspecified: Secondary | ICD-10-CM | POA: Diagnosis not present

## 2016-06-20 MED ORDER — ALBUTEROL SULFATE HFA 108 (90 BASE) MCG/ACT IN AERS: 2.0000 | INHALATION_SPRAY | RESPIRATORY_TRACT | 3 refills | Status: DC | PRN

## 2016-06-20 MED ORDER — AMOXICILLIN 875 MG PO TABS: 875.0000 mg | ORAL_TABLET | Freq: Two times a day (BID) | ORAL | 2 refills | Status: DC

## 2016-06-20 MED ORDER — PREDNISONE 20 MG PO TABS: ORAL_TABLET | ORAL | 0 refills | Status: DC

## 2016-06-20 NOTE — ED Triage Notes (Signed)
Patient reports a 3 day history of cough, productive cough of black phlegm, night sweats.  Movement causes pain in chest.  Using friends inhaler.
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
## 2018-12-23 ENCOUNTER — Other Ambulatory Visit: Payer: Self-pay

## 2018-12-23 ENCOUNTER — Ambulatory Visit (HOSPITAL_COMMUNITY)
Admission: EM | Admit: 2018-12-23 | Discharge: 2018-12-23 | Disposition: A | Discharge status: Home / Self Care | Payer: BLUE CROSS/BLUE SHIELD | Attending: Family Medicine | Admitting: Family Medicine

## 2018-12-23 ENCOUNTER — Encounter (HOSPITAL_COMMUNITY): Payer: Self-pay

## 2018-12-23 DIAGNOSIS — J069 Acute upper respiratory infection, unspecified: Secondary | ICD-10-CM

## 2018-12-23 DIAGNOSIS — B9789 Other viral agents as the cause of diseases classified elsewhere: Secondary | ICD-10-CM | POA: Diagnosis not present

## 2018-12-23 DIAGNOSIS — S39012A Strain of muscle, fascia and tendon of lower back, initial encounter: Secondary | ICD-10-CM | POA: Diagnosis not present

## 2018-12-23 MED ORDER — NAPROXEN 375 MG PO TABS: 375.0000 mg | ORAL_TABLET | Freq: Two times a day (BID) | ORAL | 0 refills | Status: DC

## 2018-12-23 MED ORDER — CYCLOBENZAPRINE HCL 5 MG PO TABS: 5.0000 mg | ORAL_TABLET | Freq: Two times a day (BID) | ORAL | 0 refills | Status: DC | PRN

## 2018-12-23 MED ORDER — ALBUTEROL SULFATE HFA 108 (90 BASE) MCG/ACT IN AERS: 1.0000 | INHALATION_SPRAY | Freq: Four times a day (QID) | RESPIRATORY_TRACT | 0 refills | Status: DC | PRN

## 2018-12-23 MED ORDER — BENZONATATE 200 MG PO CAPS: 200.0000 mg | ORAL_CAPSULE | Freq: Three times a day (TID) | ORAL | 0 refills | Status: AC | PRN
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
## 2018-12-23 NOTE — ED Triage Notes (Signed)
Pt c/o cough, SOB, and back pain x3days; states feels like bronchitis

## 2018-12-23 NOTE — Discharge Instructions (Signed)
Back Pain Naprosyn twice daily with food You may use flexeril as needed to help with pain. This is a muscle relaxer and causes sedation- please use only at bedtime or when you will be home and not have to drive/work  Cough Tessalon as needed for cough every 8 hours Albuterol inhaler as needed for shortness of breath, chest tightness  Follow up if symptoms not improving, worsening, changing

## 2019-01-03 ENCOUNTER — Ambulatory Visit
Admission: EM | Admit: 2019-01-03 | Discharge: 2019-01-03 | Disposition: A | Discharge status: Home / Self Care | Payer: BLUE CROSS/BLUE SHIELD | Attending: Physician Assistant | Admitting: Physician Assistant

## 2019-01-03 ENCOUNTER — Encounter: Payer: Self-pay | Admitting: Emergency Medicine

## 2019-01-03 ENCOUNTER — Other Ambulatory Visit: Payer: Self-pay

## 2019-01-03 DIAGNOSIS — R300 Dysuria: Secondary | ICD-10-CM | POA: Diagnosis present

## 2019-01-03 DIAGNOSIS — J209 Acute bronchitis, unspecified: Secondary | ICD-10-CM | POA: Diagnosis present

## 2019-01-03 DIAGNOSIS — F17200 Nicotine dependence, unspecified, uncomplicated: Secondary | ICD-10-CM | POA: Diagnosis not present

## 2019-01-03 LAB — POCT URINALYSIS DIP (MANUAL ENTRY)
Bilirubin, UA: NEGATIVE
Blood, UA: NEGATIVE
Glucose, UA: 100 mg/dL — AB
Leukocytes, UA: NEGATIVE
Nitrite, UA: NEGATIVE
Protein Ur, POC: 100 mg/dL — AB
Spec Grav, UA: >=1.03 — AB (ref 1.010–1.025)
Urobilinogen, UA: 1 E.U./dL
pH, UA: 6 (ref 5.0–8.0)

## 2019-01-03 LAB — POCT FASTING CBG KUC MANUAL ENTRY: POCT Glucose (KUC): 103 mg/dL — AB (ref 70–99)

## 2019-01-03 MED ORDER — PREDNISONE 50 MG PO TABS: 50.0000 mg | ORAL_TABLET | Freq: Every day | ORAL | 0 refills | Status: DC

## 2019-01-03 MED ORDER — DOXYCYCLINE HYCLATE 100 MG PO CAPS: 100.0000 mg | ORAL_CAPSULE | Freq: Two times a day (BID) | ORAL | 0 refills | Status: DC

## 2019-01-03 NOTE — Discharge Instructions (Addendum)
Start prednisone and doxycycline for bronchitis. As discussed, your symptoms still could be caused by COVID and will need to isolate until cough has resolved. If experiencing shortness of breath, trouble breathing, call 911 and provide them with your current situation.   Urine negative for infection, blood. Cytology sent, you will be contacted with any positive results that requires further treatment. Refrain from sexual activity and alcohol use for the next 7 days. If experiencing penile lesion/sore, testicular swelling/pain, follow up for reevaluation needed.
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
## 2019-01-03 NOTE — ED Triage Notes (Signed)
Pt presents to [facility] for assessment of continuing dry cough, back pain.  States he also did not admit at his last assessment that he has been having pain with urination x 2+ weeks.  Pt states his chest pain had gone away, but that it came back two days ago after lifting a car battery.

## 2019-01-03 NOTE — ED Notes (Signed)
Patient able to ambulate independently  

## 2019-01-05 LAB — URINE CYTOLOGY ANCILLARY ONLY
Chlamydia: NEGATIVE
Neisseria Gonorrhea: NEGATIVE
Trichomonas: NEGATIVE

## 2021-08-07 ENCOUNTER — Emergency Department (HOSPITAL_COMMUNITY): Payer: Self-pay

## 2021-08-07 ENCOUNTER — Encounter (HOSPITAL_COMMUNITY): Payer: BLUE CROSS/BLUE SHIELD

## 2021-08-07 ENCOUNTER — Observation Stay (HOSPITAL_COMMUNITY)
Admission: EM | Admit: 2021-08-07 | Discharge: 2021-08-08 | Disposition: A | Discharge status: Home / Self Care | Payer: Self-pay | Attending: Internal Medicine | Admitting: Internal Medicine

## 2021-08-07 ENCOUNTER — Encounter (HOSPITAL_COMMUNITY): Payer: Self-pay | Admitting: Emergency Medicine

## 2021-08-07 ENCOUNTER — Other Ambulatory Visit: Payer: Self-pay

## 2021-08-07 DIAGNOSIS — I82409 Acute embolism and thrombosis of unspecified deep veins of unspecified lower extremity: Secondary | ICD-10-CM | POA: Diagnosis present

## 2021-08-07 DIAGNOSIS — E871 Hypo-osmolality and hyponatremia: Secondary | ICD-10-CM | POA: Insufficient documentation

## 2021-08-07 DIAGNOSIS — Z720 Tobacco use: Secondary | ICD-10-CM

## 2021-08-07 DIAGNOSIS — F17210 Nicotine dependence, cigarettes, uncomplicated: Secondary | ICD-10-CM | POA: Insufficient documentation

## 2021-08-07 DIAGNOSIS — Z20822 Contact with and (suspected) exposure to covid-19: Secondary | ICD-10-CM | POA: Insufficient documentation

## 2021-08-07 DIAGNOSIS — I2693 Single subsegmental pulmonary embolism without acute cor pulmonale: Secondary | ICD-10-CM

## 2021-08-07 DIAGNOSIS — E876 Hypokalemia: Secondary | ICD-10-CM | POA: Diagnosis present

## 2021-08-07 DIAGNOSIS — I2699 Other pulmonary embolism without acute cor pulmonale: Principal | ICD-10-CM | POA: Diagnosis present

## 2021-08-07 DIAGNOSIS — J90 Pleural effusion, not elsewhere classified: Secondary | ICD-10-CM | POA: Insufficient documentation

## 2021-08-07 DIAGNOSIS — J439 Emphysema, unspecified: Secondary | ICD-10-CM | POA: Insufficient documentation

## 2021-08-07 DIAGNOSIS — Z7901 Long term (current) use of anticoagulants: Secondary | ICD-10-CM | POA: Insufficient documentation

## 2021-08-07 LAB — RESP PANEL BY RT-PCR (FLU A&B, COVID) ARPGX2
Influenza A by PCR: NEGATIVE
Influenza B by PCR: NEGATIVE
SARS Coronavirus 2 by RT PCR: NEGATIVE

## 2021-08-07 LAB — BASIC METABOLIC PANEL
Anion gap: 9 (ref 5–15)
BUN: 8 mg/dL (ref 6–20)
CO2: 23 mmol/L (ref 22–32)
Calcium: 8.7 mg/dL — ABNORMAL LOW (ref 8.9–10.3)
Chloride: 102 mmol/L (ref 98–111)
Creatinine, Ser: 0.89 mg/dL (ref 0.61–1.24)
GFR, Estimated: >60 mL/min (ref >60)
Glucose, Bld: 94 mg/dL (ref 70–99)
Potassium: 3.4 mmol/L — ABNORMAL LOW (ref 3.5–5.1)
Sodium: 134 mmol/L — ABNORMAL LOW (ref 135–145)

## 2021-08-07 LAB — RAPID URINE DRUG SCREEN, HOSP PERFORMED
Amphetamines: NOT DETECTED
Barbiturates: NOT DETECTED
Benzodiazepines: NOT DETECTED
Cocaine: NOT DETECTED
Opiates: NOT DETECTED
Tetrahydrocannabinol: POSITIVE — AB

## 2021-08-07 LAB — CBC WITH DIFFERENTIAL/PLATELET
Abs Immature Granulocytes: 0.03 10*3/uL (ref 0.00–0.07)
Basophils Absolute: 0.1 10*3/uL (ref 0.0–0.1)
Basophils Relative: 1 %
Eosinophils Absolute: 0.2 10*3/uL (ref 0.0–0.5)
Eosinophils Relative: 2 %
HCT: 45.1 % (ref 39.0–52.0)
Hemoglobin: 15.1 g/dL (ref 13.0–17.0)
Immature Granulocytes: 0 %
Lymphocytes Relative: 23 %
Lymphs Abs: 2.3 10*3/uL (ref 0.7–4.0)
MCH: 33.2 pg (ref 26.0–34.0)
MCHC: 33.5 g/dL (ref 30.0–36.0)
MCV: 99.1 fL (ref 80.0–100.0)
Monocytes Absolute: 1 10*3/uL (ref 0.1–1.0)
Monocytes Relative: 10 %
Neutro Abs: 6.5 10*3/uL (ref 1.7–7.7)
Neutrophils Relative %: 64 %
Platelets: 252 10*3/uL (ref 150–400)
RBC: 4.55 MIL/uL (ref 4.22–5.81)
RDW: 12.5 % (ref 11.5–15.5)
WBC: 10.1 10*3/uL (ref 4.0–10.5)
nRBC: 0 % (ref 0.0–0.2)

## 2021-08-07 LAB — URINALYSIS, ROUTINE W REFLEX MICROSCOPIC
Bilirubin Urine: NEGATIVE
Glucose, UA: NEGATIVE mg/dL
Hgb urine dipstick: NEGATIVE
Ketones, ur: NEGATIVE mg/dL
Leukocytes,Ua: NEGATIVE
Nitrite: NEGATIVE
Protein, ur: NEGATIVE mg/dL
Specific Gravity, Urine: 1.023 (ref 1.005–1.030)
pH: 6 (ref 5.0–8.0)

## 2021-08-07 LAB — TROPONIN I (HIGH SENSITIVITY)
Troponin I (High Sensitivity): 3 ng/L (ref <18)
Troponin I (High Sensitivity): 4 ng/L (ref <18)

## 2021-08-07 LAB — BRAIN NATRIURETIC PEPTIDE: B Natriuretic Peptide: 9 pg/mL (ref 0.0–100.0)

## 2021-08-07 LAB — LACTIC ACID, PLASMA: Lactic Acid, Venous: 0.7 mmol/L (ref 0.5–1.9)

## 2021-08-07 LAB — PROTIME-INR
INR: 1.1 (ref 0.8–1.2)
Prothrombin Time: 14.5 seconds (ref 11.4–15.2)

## 2021-08-07 LAB — HIV ANTIBODY (ROUTINE TESTING W REFLEX): HIV Screen 4th Generation wRfx: NONREACTIVE

## 2021-08-07 LAB — APTT: aPTT: 32 seconds (ref 24–36)

## 2021-08-07 LAB — D-DIMER, QUANTITATIVE: D-Dimer, Quant: 1.07 ug/mL-FEU — ABNORMAL HIGH (ref 0.00–0.50)

## 2021-08-07 MED ORDER — ACETAMINOPHEN 650 MG RE SUPP: 650.0000 mg | Freq: Four times a day (QID) | RECTAL | Status: DC | PRN

## 2021-08-07 MED ORDER — ONDANSETRON HCL 4 MG PO TABS: 4.0000 mg | ORAL_TABLET | Freq: Four times a day (QID) | ORAL | Status: DC | PRN

## 2021-08-07 MED ORDER — MORPHINE SULFATE (PF) 4 MG/ML IV SOLN
4.0000 mg | Freq: Once | INTRAVENOUS | Status: AC
  Administered 2021-08-07: 13:00:00 4 mg via INTRAVENOUS
  Filled 2021-08-07: qty 1

## 2021-08-07 MED ORDER — POTASSIUM CHLORIDE CRYS ER 20 MEQ PO TBCR
20.0000 meq | EXTENDED_RELEASE_TABLET | ORAL | Status: AC
  Administered 2021-08-07: 17:00:00 20 meq via ORAL
  Filled 2021-08-07: qty 1

## 2021-08-07 MED ORDER — HEPARIN (PORCINE) 25000 UT/250ML-% IV SOLN
1500.0000 [IU]/h | INTRAVENOUS | Status: DC
  Administered 2021-08-07: 16:00:00 1500 [IU]/h via INTRAVENOUS
  Filled 2021-08-07: qty 250

## 2021-08-07 MED ORDER — IOHEXOL 350 MG/ML SOLN
65.0000 mL | Freq: Once | INTRAVENOUS | Status: AC | PRN
  Administered 2021-08-07: 14:00:00 65 mL via INTRAVENOUS

## 2021-08-07 MED ORDER — ALBUTEROL SULFATE (2.5 MG/3ML) 0.083% IN NEBU: 2.5000 mg | INHALATION_SOLUTION | Freq: Four times a day (QID) | RESPIRATORY_TRACT | Status: DC | PRN

## 2021-08-07 MED ORDER — NICOTINE 21 MG/24HR TD PT24
21.0000 mg | MEDICATED_PATCH | Freq: Every day | TRANSDERMAL | Status: DC | PRN
  Filled 2021-08-07: qty 1

## 2021-08-07 MED ORDER — MORPHINE SULFATE (PF) 2 MG/ML IV SOLN
2.0000 mg | INTRAVENOUS | Status: AC | PRN
  Administered 2021-08-07 – 2021-08-08 (×3): 2 mg via INTRAVENOUS
  Filled 2021-08-07 (×3): qty 1

## 2021-08-07 MED ORDER — OXYCODONE-ACETAMINOPHEN 5-325 MG PO TABS
1.0000 | ORAL_TABLET | Freq: Once | ORAL | Status: AC
  Administered 2021-08-07: 03:00:00 1 via ORAL
  Filled 2021-08-07: qty 1

## 2021-08-07 MED ORDER — SODIUM CHLORIDE 0.9% FLUSH
3.0000 mL | Freq: Two times a day (BID) | INTRAVENOUS | Status: DC
  Administered 2021-08-07 – 2021-08-08 (×2): 3 mL via INTRAVENOUS

## 2021-08-07 MED ORDER — ONDANSETRON HCL 4 MG/2ML IJ SOLN: 4.0000 mg | Freq: Four times a day (QID) | INTRAMUSCULAR | Status: DC | PRN

## 2021-08-07 MED ORDER — APIXABAN 5 MG PO TABS: 5.0000 mg | ORAL_TABLET | Freq: Two times a day (BID) | ORAL | Status: DC

## 2021-08-07 MED ORDER — METHOCARBAMOL 500 MG PO TABS
500.0000 mg | ORAL_TABLET | Freq: Three times a day (TID) | ORAL | Status: DC | PRN
  Administered 2021-08-07: 20:00:00 500 mg via ORAL
  Filled 2021-08-07: qty 1

## 2021-08-07 MED ORDER — OXYCODONE-ACETAMINOPHEN 5-325 MG PO TABS
1.0000 | ORAL_TABLET | ORAL | Status: DC | PRN
  Administered 2021-08-07 (×2): 1 via ORAL
  Filled 2021-08-07 (×2): qty 1

## 2021-08-07 MED ORDER — APIXABAN 5 MG PO TABS
10.0000 mg | ORAL_TABLET | Freq: Two times a day (BID) | ORAL | Status: DC
  Administered 2021-08-07 – 2021-08-08 (×2): 10 mg via ORAL
  Filled 2021-08-07 (×2): qty 2

## 2021-08-07 MED ORDER — MORPHINE SULFATE (PF) 4 MG/ML IV SOLN
4.0000 mg | Freq: Once | INTRAVENOUS | Status: AC
  Administered 2021-08-07: 18:00:00 4 mg via INTRAVENOUS
  Filled 2021-08-07: qty 1

## 2021-08-07 MED ORDER — HEPARIN BOLUS VIA INFUSION
6000.0000 [IU] | Freq: Once | INTRAVENOUS | Status: AC
  Administered 2021-08-07: 16:00:00 6000 [IU] via INTRAVENOUS
  Filled 2021-08-07: qty 6000

## 2021-08-07 MED ORDER — CALCIUM GLUCONATE-NACL 1-0.675 GM/50ML-% IV SOLN
1.0000 g | Freq: Once | INTRAVENOUS | Status: AC
  Administered 2021-08-07: 17:00:00 1000 mg via INTRAVENOUS
  Filled 2021-08-07: qty 50

## 2021-08-07 MED ORDER — ACETAMINOPHEN 325 MG PO TABS: 650.0000 mg | ORAL_TABLET | Freq: Four times a day (QID) | ORAL | Status: DC | PRN

## 2021-08-07 MED ORDER — METHOCARBAMOL 1000 MG/10ML IJ SOLN
500.0000 mg | Freq: Once | INTRAVENOUS | Status: AC
  Administered 2021-08-07: 18:00:00 500 mg via INTRAVENOUS
  Filled 2021-08-07: qty 5
  Filled 2021-08-07: qty 500

## 2021-08-07 NOTE — ED Notes (Signed)
Pt c/o calf pain and chest pain worse on inspiration, primarily on R side. VSS, no distress noted. Awaiting provider.

## 2021-08-07 NOTE — ED Triage Notes (Signed)
Patient arrived with EMS from home reports central chest pain , right back pain radiating down to right leg onset 6 days ago with SOB .
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
## 2021-08-07 NOTE — ED Provider Notes (Signed)
Emergency Medicine Provider Triage Evaluation Note  Theodore Ford , a 30 y.o. male  was evaluated in triage.  Pt complains of right sided CP and SOB.  States that it has been going on for about 6 days.  Denies any fever or chills.  Denies any recent injuries.  It is worsened with movement.  Review of Systems  Positive: Flank pain, right chest pain Negative: Fever, chills  Physical Exam  Ht 6\' 1"  (1.854 m)    Wt 82 kg    BMI 23.85 kg/m  Gen:   Awake, no distress   Resp:  Normal effort  MSK:   Moves extremities without difficulty  Other:    Medical Decision Making  Medically screening exam initiated at 3:22 AM.  Appropriate orders placed.  Theodore Ford was informed that the remainder of the evaluation will be completed by another provider, this initial triage assessment does not replace that evaluation, and the importance of remaining in the ED until their evaluation is complete.  Right chest pain   Katina Degree, PA-C 08/07/21 08/09/21    [id number], MD 08/07/21 [phone]

## 2021-08-08 ENCOUNTER — Other Ambulatory Visit (HOSPITAL_COMMUNITY): Payer: Self-pay

## 2021-08-08 ENCOUNTER — Observation Stay (HOSPITAL_BASED_OUTPATIENT_CLINIC_OR_DEPARTMENT_OTHER): Payer: Self-pay

## 2021-08-08 DIAGNOSIS — I2693 Single subsegmental pulmonary embolism without acute cor pulmonale: Secondary | ICD-10-CM | POA: Diagnosis not present

## 2021-08-08 DIAGNOSIS — I82409 Acute embolism and thrombosis of unspecified deep veins of unspecified lower extremity: Secondary | ICD-10-CM | POA: Diagnosis present

## 2021-08-08 DIAGNOSIS — I2699 Other pulmonary embolism without acute cor pulmonale: Secondary | ICD-10-CM

## 2021-08-08 LAB — CBC
HCT: 42.9 % (ref 39.0–52.0)
Hemoglobin: 14.5 g/dL (ref 13.0–17.0)
MCH: 32.8 pg (ref 26.0–34.0)
MCHC: 33.8 g/dL (ref 30.0–36.0)
MCV: 97.1 fL (ref 80.0–100.0)
Platelets: 246 10*3/uL (ref 150–400)
RBC: 4.42 MIL/uL (ref 4.22–5.81)
RDW: 12.5 % (ref 11.5–15.5)
WBC: 10 10*3/uL (ref 4.0–10.5)
nRBC: 0 % (ref 0.0–0.2)

## 2021-08-08 LAB — ECHOCARDIOGRAM COMPLETE
Area-P 1/2: 3.77 cm2
Height: 73 in
S' Lateral: 3.8 cm
Weight: 2577.6 oz

## 2021-08-08 LAB — BASIC METABOLIC PANEL
Anion gap: 6 (ref 5–15)
BUN: <5 mg/dL — ABNORMAL LOW (ref 6–20)
CO2: 27 mmol/L (ref 22–32)
Calcium: 8.8 mg/dL — ABNORMAL LOW (ref 8.9–10.3)
Chloride: 102 mmol/L (ref 98–111)
Creatinine, Ser: 0.76 mg/dL (ref 0.61–1.24)
GFR, Estimated: >60 mL/min (ref >60)
Glucose, Bld: 86 mg/dL (ref 70–99)
Potassium: 3.7 mmol/L (ref 3.5–5.1)
Sodium: 135 mmol/L (ref 135–145)

## 2021-08-08 LAB — MRSA NEXT GEN BY PCR, NASAL: MRSA by PCR Next Gen: NOT DETECTED

## 2021-08-08 LAB — MAGNESIUM: Magnesium: 1.7 mg/dL (ref 1.7–2.4)

## 2021-08-08 MED ORDER — ACETAMINOPHEN 500 MG PO TABS: 1000.0000 mg | ORAL_TABLET | Freq: Four times a day (QID) | ORAL | Status: DC | PRN

## 2021-08-08 MED ORDER — NICOTINE 21 MG/24HR TD PT24: 21.0000 mg | MEDICATED_PATCH | Freq: Every day | TRANSDERMAL | 0 refills | Status: DC | PRN

## 2021-08-08 MED ORDER — APIXABAN (ELIQUIS) VTE STARTER PACK (10MG AND 5MG)
ORAL_TABLET | ORAL | 0 refills | Status: DC
  Filled 2021-08-08: qty 74, 30d supply, fill #0

## 2021-08-08 NOTE — Progress Notes (Signed)
Patient discharged: Home with family.  Left via: Wheelchair  Discharge paperwork reviewed and given: to patient and family. Teach back completed. IV and telemetry disconnected. Belongings given to patient.  

## 2021-08-08 NOTE — Discharge Summary (Signed)
Physician Discharge Summary  Theodore Ford [id number] DOB: [birth year] DOA: 08/07/2021  PCP: Patient, No Pcp Per (Inactive)  Admit date: 08/07/2021 Discharge date: 08/08/2021  Admitted From: Home Disposition: Home  Recommendations for Outpatient Follow-up:  Follow up with PCP in 1-2 weeks as a scheduled. Take anticoagulation medications without interruption.  Home Health: N/A Equipment/Devices: N/A  Discharge Condition: Stable CODE STATUS: Full code Diet recommendation: Regular diet  Discharge summary:  30 year old with no medical issues started about a week ago with charley horses in the right leg, chest pain and discomfort for 1 day and flank pain.  Came to the emergency room.  Hemodynamically stable.  On room air.  CTA showed bilateral lower lobe subsegmental PE with right lower lobe pulmonary infarction.  Lower extremity duplexes showed right lower extremity DVT.  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  No other provocative factors including recent travel or surgeries.  No family history of thromboembolism.  Patient was admitted for monitoring.  Initially started on heparin and subsequently converted to Eliquis.  Tolerating well.  On room air.  He does have some pleuritic chest pain but he is on room air and mobilizing around.  Echocardiogram was normal.  Patient was discharged home on Eliquis, supportive treatment, PCP follow-up scheduled.  Advised to change lifestyle.  Uninterrupted anticoagulation.  Discharge Diagnoses:  Principal Problem:   Pulmonary embolus (HCC) Active Problems:   Pulmonary infarction [facility])   Tobacco abuse   Hypokalemia   Hypocalcemia    Discharge Instructions  Discharge Instructions     Call MD for:  difficulty breathing, headache or visual disturbances   Complete by: As directed    Call MD for:  persistant dizziness or light-headedness   Complete by: As directed    Call MD for:   severe uncontrolled pain   Complete by: As directed    Diet general   Complete by: As directed    Discharge instructions   Complete by: As directed    Take blood thinners uninterrupted , make sure you have refills  Follow up with primary care physician   Increase activity slowly   Complete by: As directed    Other Restrictions   Complete by: As directed    No heavy exertion for 2 weeks      Allergies as of 08/08/2021   No Known Allergies      Medication List     STOP taking these medications    doxycycline 100 MG capsule Commonly known as: VIBRAMYCIN   predniSONE 50 MG tablet Commonly known as: DELTASONE       TAKE these medications    acetaminophen 500 MG tablet Commonly known as: TYLENOL Take 2 tablets (1,000 mg total) by mouth every 6 (six) hours as needed for mild pain or moderate pain (or Fever >/= 101).   Eliquis DVT/PE Starter Pack Generic drug: Apixaban Starter Pack (10mg  and 5mg ) Take as directed on package: start with two-5mg  tablets twice daily for 7 days. On day 8, switch to one-5mg  tablet twice daily.   nicotine 21 mg/24hr patch Commonly known as: NICODERM CQ - dosed in mg/24 hours Place 1 patch (21 mg total) onto the skin daily as needed (smoking cessation).        Follow-up Information     Delene Ruffini, MD. Go on 08/18/2021.   Specialty: Internal Medicine Why: Time 1:15, PCP establishment, Please arrive 15 min early to complete paperwork Contact information: [address] [place] [address] [phone]                No Known Allergies  Consultations: None   Procedures/Studies: DG Chest 2 View  Result Date: 08/07/2021 CLINICAL DATA:  Chest pain and back pain with shortness of breath EXAM: CHEST - 2 VIEW COMPARISON:  September 03, 2015 FINDINGS: The heart size and mediastinal contours are within normal limits. Both lungs are clear. The visualized skeletal structures are unremarkable. IMPRESSION: No active  cardiopulmonary disease. Electronically Signed   By: Aram Candelahaddeus  Houston M.D.   On: 08/07/2021 04:19   CT Angio Chest PE W and/or Wo Contrast  Result Date: 08/07/2021 CLINICAL DATA:  pleuritic chest and back pain, elevated dimer EXAM: CT ANGIOGRAPHY CHEST WITH CONTRAST TECHNIQUE: Multidetector CT imaging of the chest was performed using the standard protocol during bolus administration of intravenous contrast. Multiplanar CT image reconstructions and MIPs were obtained to evaluate the vascular anatomy. CONTRAST:  65mL OMNIPAQUE IOHEXOL 350 MG/ML SOLN COMPARISON:  Chest radiograph from earlier today. 09/03/2015 chest CT. FINDINGS: Cardiovascular: The study is high quality for the evaluation of pulmonary embolism. Acute subsegmental pulmonary embolism in the right lower lobe (series 7/images 259-289). No additional foci of acute pulmonary embolism. Great vessels are normal in course and caliber. Normal heart size. No significant pericardial fluid/thickening. Mediastinum/Nodes: No discrete thyroid nodules. Unremarkable esophagus. No pathologically enlarged axillary, mediastinal or hilar lymph nodes. Lungs/Pleura: No pneumothorax. Trace dependent right pleural effusion. No left pleural effusion. Mild centrilobular and paraseptal emphysema with mild diffuse bronchial wall thickening. Patchy consolidation and ground-glass opacity in the dependent basilar right lower lobe (series 6/image 103) with a vaguely triangular configuration. No lung masses or significant pulmonary nodules. Upper abdomen: No acute abnormality. Musculoskeletal:  No aggressive appearing focal osseous lesions. Review of the MIP images confirms the above findings. IMPRESSION: 1. Acute subsegmental right lower lobe pulmonary embolism. 2. Patchy consolidation and ground-glass opacity in the dependent basilar right lower lobe with a vaguely triangular configuration, compatible with an acute pulmonary infarct. 3. Trace dependent right pleural effusion.  4. Mild emphysema with mild diffuse bronchial wall thickening, suggesting COPD. 5. Emphysema (ICD10-J43.9). Critical Value/emergent results were called by telephone at the time of interpretation on 08/07/2021 at 1:53 pm to provider DR. Edwin DadaALICIA GRAY, who verbally acknowledged these results. Electronically Signed   By: Delbert PhenixJason A Poff M.D.   On: 08/07/2021 13:57   ECHOCARDIOGRAM COMPLETE  Result Date: 08/08/2021    ECHOCARDIOGRAM REPORT   Patient Name:   Theodore Ford Date of Exam: 08/08/2021 Medical Rec #:  [id number]            Height:       73.0 in Accession #:    [id number]           Weight:       161.1 lb Date of Birth:  03/15/1991            BSA:          1.963 m Patient Age:    30 years             BP:           111/75 mmHg Patient Gender: M                    HR:           52 bpm. Exam Location:  Inpatient Procedure: 2D Echo Indications:    pulmonary embolus  History:  Patient has no prior history of Echocardiogram examinations.                 Risk Factors:Current Smoker.  Sonographer:    Delcie Roch RDCS Referring Phys: [phone] RONDELL A SMITH IMPRESSIONS  1. Left ventricular ejection fraction, by estimation, is 60 to 65%. The left ventricle has normal function. The left ventricle has no regional wall motion abnormalities. Left ventricular diastolic parameters were normal.  2. Right ventricular systolic function is normal. The right ventricular size is normal. Tricuspid regurgitation signal is inadequate for assessing PA pressure.  3. The mitral valve is grossly normal. Trivial mitral valve regurgitation. No evidence of mitral stenosis.  4. The aortic valve is tricuspid. Aortic valve regurgitation is not visualized. No aortic stenosis is present.  5. The inferior vena cava is dilated in size with <50% respiratory variability, suggesting right atrial pressure of 15 mmHg. Conclusion(s)/Recommendation(s): Normal biventricular function without evidence of hemodynamically significant valvular  heart disease. FINDINGS  Left Ventricle: Left ventricular ejection fraction, by estimation, is 60 to 65%. The left ventricle has normal function. The left ventricle has no regional wall motion abnormalities. The left ventricular internal cavity size was normal in size. There is  no left ventricular hypertrophy. Left ventricular diastolic parameters were normal. Right Ventricle: The right ventricular size is normal. No increase in right ventricular wall thickness. Right ventricular systolic function is normal. Tricuspid regurgitation signal is inadequate for assessing PA pressure. Left Atrium: Left atrial size was normal in size. Right Atrium: Right atrial size was normal in size. Pericardium: There is no evidence of pericardial effusion. Mitral Valve: The mitral valve is grossly normal. Trivial mitral valve regurgitation. No evidence of mitral valve stenosis. Tricuspid Valve: The tricuspid valve is grossly normal. Tricuspid valve regurgitation is trivial. No evidence of tricuspid stenosis. Aortic Valve: The aortic valve is tricuspid. Aortic valve regurgitation is not visualized. No aortic stenosis is present. Pulmonic Valve: The pulmonic valve was grossly normal. Pulmonic valve regurgitation is not visualized. No evidence of pulmonic stenosis. Aorta: The aortic root and ascending aorta are structurally normal, with no evidence of dilitation. Venous: The inferior vena cava is dilated in size with less than 50% respiratory variability, suggesting right atrial pressure of 15 mmHg. IAS/Shunts: The atrial septum is grossly normal.  LEFT VENTRICLE PLAX 2D LVIDd:         5.50 cm   Diastology LVIDs:         3.80 cm   LV e' medial:    11.60 cm/s LV PW:         0.80 cm   LV E/e' medial:  7.2 LV IVS:        0.70 cm   LV e' lateral:   15.70 cm/s LVOT diam:     1.80 cm   LV E/e' lateral: 5.3 LV SV:         54 LV SV Index:   28 LVOT Area:     2.54 cm  RIGHT VENTRICLE         IVC TAPSE (M-mode): 2.1 cm  IVC diam: 2.20 cm LEFT  ATRIUM             Index        RIGHT ATRIUM           Index LA diam:        3.10 cm 1.58 cm/m   RA Area:     12.20 cm LA Vol (A2C):   47.0 ml 23.95 ml/m  RA Volume:   29.70 ml  15.13 ml/m LA Vol (A4C):   42.5 ml 21.65 ml/m LA Biplane Vol: 44.8 ml 22.83 ml/m  AORTIC VALVE LVOT Vmax:   108.00 cm/s LVOT Vmean:  66.700 cm/s LVOT VTI:    0.213 m  AORTA Ao Root diam: 2.80 cm Ao Asc diam:  2.90 cm MITRAL VALVE MV Area (PHT): 3.77 cm    SHUNTS MV Decel Time: 201 msec    Systemic VTI:  0.21 m MV E velocity: 83.10 cm/s  Systemic Diam: 1.80 cm MV A velocity: 48.00 cm/s MV E/A ratio:  1.73 Eleonore Chiquito MD Electronically signed by Eleonore Chiquito MD Signature Date/Time: 08/08/2021/2:18:55 PM    Final    VAS [place] LOWER EXTREMITY VENOUS (DVT) (7a-7p)  Result Date: 08/08/2021  Lower Venous DVT Study Patient Name:  Theodore Ford  Date of Exam:   08/08/2021 Medical Rec #: [id number]             Accession #:    [id number] Date of Birth: [birth year]             Patient Gender: M Patient Age:   31 years Exam Location:  [facility] Procedure:      VAS [place] LOWER EXTREMITY VENOUS (DVT) Referring Phys: ERICA CONKLIN --------------------------------------------------------------------------------  Indications: Pulmonary embolism, and history of recent right leg cramping.  Comparison Study: No prior study Performing Technologist: Maudry Mayhew MHA, RDMS, RVT, RDCS  Examination Guidelines: A complete evaluation includes B-mode imaging, spectral Doppler, color Doppler, and power Doppler as needed of all accessible portions of each vessel. Bilateral testing is considered an integral part of a complete examination. Limited examinations for reoccurring indications may be performed as noted. The reflux portion of the exam is performed with the patient in reverse Trendelenburg.  +---------+---------------+---------+-----------+----------+--------------+  RIGHT     Compressibility Phasicity Spontaneity Properties Thrombus  Aging  +---------+---------------+---------+-----------+----------+--------------+  CFV       Full            Yes       Yes                                    +---------+---------------+---------+-----------+----------+--------------+  SFJ       Full                                                             +---------+---------------+---------+-----------+----------+--------------+  FV Prox   Full                                                             +---------+---------------+---------+-----------+----------+--------------+  FV Mid    Full                                                             +---------+---------------+---------+-----------+----------+--------------+  FV Distal Full                                                             +---------+---------------+---------+-----------+----------+--------------+  PFV       Full                                                             +---------+---------------+---------+-----------+----------+--------------+  POP       Full            Yes       Yes                                    +---------+---------------+---------+-----------+----------+--------------+  PTV       Full                                                             +---------+---------------+---------+-----------+----------+--------------+  PERO      Full                                                             +---------+---------------+---------+-----------+----------+--------------+  Soleal    None                      No                     Acute           +---------+---------------+---------+-----------+----------+--------------+  Gastroc   None                      No                     Acute           +---------+---------------+---------+-----------+----------+--------------+  GSV       Full                                                             +---------+---------------+---------+-----------+----------+--------------+  SSV       Full                                                              +---------+---------------+---------+-----------+----------+--------------+   +----+---------------+---------+-----------+----------+--------------+  LEFT Compressibility Phasicity Spontaneity Properties Thrombus Aging  +----+---------------+---------+-----------+----------+--------------+  CFV  Full            Yes       Yes                                    +----+---------------+---------+-----------+----------+--------------+  Summary: RIGHT: - Findings consistent with acute intramuscular deep vein thrombosis involving the right soleal veins, and right gastrocnemius veins. - There is no evidence of superficial venous thrombosis.  - No cystic structure found in the popliteal fossa.  LEFT: - No evidence of common femoral vein obstruction.  *See table(s) above for measurements and observations.    Preliminary    (Echo, Carotid, EGD, Colonoscopy, ERCP)    Subjective: Patient seen and examined in the afternoon before discharge.  He had some mild flank pain, able to take deep breaths and able to walk around on room air.  Leg pain is improved on admission only.  Detailed discussion about medication management, use of high-risk medication.   Discharge Exam: Vitals:   08/08/21 0734 08/08/21 1124  BP: 111/75 110/75  Pulse: 70 68  Resp:    Temp: 99.3 F (37.4 C)   SpO2: 98% 96%   Vitals:   08/08/21 0040 08/08/21 0448 08/08/21 0734 08/08/21 1124  BP: 117/65 117/64 111/75 110/75  Pulse: 86 64 70 68  Resp: 20 20    Temp: 98.5 F (36.9 C) 99.1 F (37.3 C) 99.3 F (37.4 C)   TempSrc: Oral Oral Oral   SpO2: 96% 98% 98% 96%  Weight:  73.1 kg    Height:        General: Pt is alert, awake, not in acute distress Walking around in the room.  On room air. Cardiovascular: RRR, S1/S2 +, no rubs, no gallops Respiratory: CTA bilaterally, no wheezing, no rhonchi Abdominal: Soft, NT, ND, bowel sounds + Extremities: no edema, no cyanosis    The results of significant  diagnostics from this hospitalization (including imaging, microbiology, ancillary and laboratory) are listed below for reference.     Microbiology: Recent Results (from the past 240 hour(s))  Resp Panel by RT-PCR (Flu A&B, Covid) Nasopharyngeal Swab     Status: None   Collection Time: 08/07/21  3:18 AM   Specimen: Nasopharyngeal Swab; Nasopharyngeal(NP) swabs in vial transport medium  Result Value Ref Range Status   SARS Coronavirus 2 by RT PCR NEGATIVE NEGATIVE Final    Comment: (NOTE) SARS-CoV-2 target nucleic acids are NOT DETECTED.  The SARS-CoV-2 RNA is generally detectable in upper respiratory specimens during the acute phase of infection. The lowest concentration of SARS-CoV-2 viral copies this assay can detect is 138 copies/mL. A negative result does not preclude SARS-Cov-2 infection and should not be used as the sole basis for treatment or other patient management decisions. A negative result may occur with  improper specimen collection/handling, submission of specimen other than nasopharyngeal swab, presence of viral mutation(s) within the areas targeted by this assay, and inadequate number of viral copies(<138 copies/mL). A negative result must be combined with clinical observations, patient history, and epidemiological information. The expected result is Negative.  Fact Sheet for Patients:  EntrepreneurPulse.com.au  Fact Sheet for Healthcare Providers:  IncredibleEmployment.be  This test is no t yet approved or cleared by the [place] FDA and  has been authorized for detection and/or diagnosis of SARS-CoV-2 by FDA under an Emergency Use Authorization (EUA). This EUA will remain  in effect (meaning this test can be used) for the duration of the COVID-19 declaration under Section 564(b)(1) of the Act, 21 U.S.C.section 360bbb-3(b)(1), unless the authorization is terminated  or revoked sooner.       Influenza A by PCR NEGATIVE  NEGATIVE Final   Influenza B by PCR NEGATIVE NEGATIVE Final    Comment: (NOTE) The Xpert Xpress SARS-CoV-2/FLU/RSV  plus assay is intended as an aid in the diagnosis of influenza from Nasopharyngeal swab specimens and should not be used as a sole basis for treatment. Nasal washings and aspirates are unacceptable for Xpert Xpress SARS-CoV-2/FLU/RSV testing.  Fact Sheet for Patients: EntrepreneurPulse.com.au  Fact Sheet for Healthcare Providers: IncredibleEmployment.be  This test is not yet approved or cleared by the [place] FDA and has been authorized for detection and/or diagnosis of SARS-CoV-2 by FDA under an Emergency Use Authorization (EUA). This EUA will remain in effect (meaning this test can be used) for the duration of the COVID-19 declaration under Section 564(b)(1) of the Act, 21 U.S.C. section 360bbb-3(b)(1), unless the authorization is terminated or revoked.  Performed at [facility], [place] [address], [place], [place] [address]   MRSA Next Gen by PCR, Nasal     Status: None   Collection Time: 08/08/21 11:55 AM   Specimen: Nasal Mucosa; Nasal Swab  Result Value Ref Range Status   MRSA by PCR Next Gen NOT DETECTED NOT DETECTED Final    Comment: (NOTE) The GeneXpert MRSA Assay (FDA approved for NASAL specimens only), is one component of a comprehensive MRSA colonization surveillance program. It is not intended to diagnose MRSA infection nor to guide or monitor treatment for MRSA infections. Test performance is not FDA approved in patients less than 99 years old. Performed at [facility], [place] [address] [place], [place] [address]      Labs: BNP (last 3 results) Recent Labs    08/07/21 1410  BNP 9.0   Basic Metabolic Panel: Recent Labs  Lab 08/07/21 0327 08/08/21 0313  NA 134* 135  K 3.4* 3.7  CL 102 102  CO2 23 27  GLUCOSE 94 86  BUN 8 <5*  CREATININE 0.89 0.76  CALCIUM 8.7* 8.8*  MG  --  1.7    Liver Function Tests: No results for input(s): AST, ALT, ALKPHOS, BILITOT, PROT, ALBUMIN in the last 168 hours. No results for input(s): LIPASE, AMYLASE in the last 168 hours. No results for input(s): AMMONIA in the last 168 hours. CBC: Recent Labs  Lab 08/07/21 0327 08/08/21 0313  WBC 10.1 10.0  NEUTROABS 6.5  --   HGB 15.1 14.5  HCT 45.1 42.9  MCV 99.1 97.1  PLT 252 246   Cardiac Enzymes: No results for input(s): CKTOTAL, CKMB, CKMBINDEX, TROPONINI in the last 168 hours. BNP: Invalid input(s): POCBNP CBG: No results for input(s): GLUCAP in the last 168 hours. D-Dimer Recent Labs    08/07/21 1204  DDIMER 1.07*   Hgb A1c No results for input(s): HGBA1C in the last 72 hours. Lipid Profile No results for input(s): CHOL, HDL, LDLCALC, TRIG, CHOLHDL, LDLDIRECT in the last 72 hours. Thyroid function studies No results for input(s): TSH, T4TOTAL, T3FREE, THYROIDAB in the last 72 hours.  Invalid input(s): FREET3 Anemia work up No results for input(s): VITAMINB12, FOLATE, FERRITIN, TIBC, IRON, RETICCTPCT in the last 72 hours. Urinalysis    Component Value Date/Time   COLORURINE YELLOW 08/07/2021 0318   APPEARANCEUR HAZY (A) 08/07/2021 0318   LABSPEC 1.023 08/07/2021 0318   PHURINE 6.0 08/07/2021 0318   GLUCOSEU NEGATIVE 08/07/2021 0318   HGBUR NEGATIVE 08/07/2021 0318   BILIRUBINUR NEGATIVE 08/07/2021 0318   BILIRUBINUR negative 01/03/2019 [place] 08/07/2021 0318   PROTEINUR NEGATIVE 08/07/2021 0318   UROBILINOGEN 1.0 01/03/2019 1731   UROBILINOGEN 1.0 05/22/2015 1415   NITRITE NEGATIVE 08/07/2021 [place] 08/07/2021 0318  Sepsis Labs Invalid input(s): PROCALCITONIN,  WBC,  LACTICIDVEN Microbiology Recent Results (from the past 240 hour(s))  Resp Panel by RT-PCR (Flu A&B, Covid) Nasopharyngeal Swab     Status: None   Collection Time: 08/07/21  3:18 AM   Specimen: Nasopharyngeal Swab; Nasopharyngeal(NP) swabs in vial  transport medium  Result Value Ref Range Status   SARS Coronavirus 2 by RT PCR NEGATIVE NEGATIVE Final    Comment: (NOTE) SARS-CoV-2 target nucleic acids are NOT DETECTED.  The SARS-CoV-2 RNA is generally detectable in upper respiratory specimens during the acute phase of infection. The lowest concentration of SARS-CoV-2 viral copies this assay can detect is 138 copies/mL. A negative result does not preclude SARS-Cov-2 infection and should not be used as the sole basis for treatment or other patient management decisions. A negative result may occur with  improper specimen collection/handling, submission of specimen other than nasopharyngeal swab, presence of viral mutation(s) within the areas targeted by this assay, and inadequate number of viral copies(<138 copies/mL). A negative result must be combined with clinical observations, patient history, and epidemiological information. The expected result is Negative.  Fact Sheet for Patients:  EntrepreneurPulse.com.au  Fact Sheet for Healthcare Providers:  IncredibleEmployment.be  This test is no t yet approved or cleared by the [place] FDA and  has been authorized for detection and/or diagnosis of SARS-CoV-2 by FDA under an Emergency Use Authorization (EUA). This EUA will remain  in effect (meaning this test can be used) for the duration of the COVID-19 declaration under Section 564(b)(1) of the Act, 21 U.S.C.section 360bbb-3(b)(1), unless the authorization is terminated  or revoked sooner.       Influenza A by PCR NEGATIVE NEGATIVE Final   Influenza B by PCR NEGATIVE NEGATIVE Final    Comment: (NOTE) The Xpert Xpress SARS-CoV-2/FLU/RSV plus assay is intended as an aid in the diagnosis of influenza from Nasopharyngeal swab specimens and should not be used as a sole basis for treatment. Nasal washings and aspirates are unacceptable for Xpert Xpress SARS-CoV-2/FLU/RSV testing.  Fact  Sheet for Patients: EntrepreneurPulse.com.au  Fact Sheet for Healthcare Providers: IncredibleEmployment.be  This test is not yet approved or cleared by the [place] FDA and has been authorized for detection and/or diagnosis of SARS-CoV-2 by FDA under an Emergency Use Authorization (EUA). This EUA will remain in effect (meaning this test can be used) for the duration of the COVID-19 declaration under Section 564(b)(1) of the Act, 21 U.S.C. section 360bbb-3(b)(1), unless the authorization is terminated or revoked.  Performed at [facility], [place] [address] [place], [place] [address]   MRSA Next Gen by PCR, Nasal     Status: None   Collection Time: 08/08/21 11:55 AM   Specimen: Nasal Mucosa; Nasal Swab  Result Value Ref Range Status   MRSA by PCR Next Gen NOT DETECTED NOT DETECTED Final    Comment: (NOTE) The GeneXpert MRSA Assay (FDA approved for NASAL specimens only), is one component of a comprehensive MRSA colonization surveillance program. It is not intended to diagnose MRSA infection nor to guide or monitor treatment for MRSA infections. Test performance is not FDA approved in patients less than 39 years old. Performed at [facility], [place] [address], [place], [place] [address]      Time coordinating discharge: 30 minutes  SIGNED:   Barb Merino, MD  [facility] 08/08/2021, 3:26 PM

## 2021-08-08 NOTE — TOC Progression Note (Signed)
Transition of Care [facility]) - Progression Note    Patient Details  Name: Theodore Ford MRN: [id number] Date of Birth: 27-Dec-1990  [facility]) CM/SW Contact  Beckie Busing, RN Phone Number:[phone]  08/08/2021, 2:21 PM  Clinical Narrative:    Meds have been delivered to bedside per [facility] pharmacy and hospital follow up with PCP has been set up.      Barriers to Discharge: ED Uninsured needing medication assistance, ED Uninsured needing PCP establishment  Expected Discharge Plan and Services                                                 Social Determinants of Health (SDOH) Interventions    Readmission Risk Interventions No flowsheet data found.

## 2021-08-08 NOTE — Discharge Instructions (Addendum)
Information on my medicine - ELIQUIS (apixaban)  Why was Eliquis prescribed for you? Eliquis was prescribed to treat blood clots that may have been found in the veins of your legs (deep vein thrombosis) or in your lungs (pulmonary embolism) and to reduce the risk of them occurring again.  What do You need to know about Eliquis ? The starting dose is 10 mg (two 5 mg tablets) taken TWICE daily for the FIRST SEVEN (7) DAYS, then on 08/14/21  the dose is reduced to ONE 5 mg tablet taken TWICE daily.  Eliquis may be taken with or without food.   Try to take the dose about the same time in the morning and in the evening. If you have difficulty swallowing the tablet whole please discuss with your pharmacist how to take the medication safely.  Take Eliquis exactly as prescribed and DO NOT stop taking Eliquis without talking to the doctor who prescribed the medication.  Stopping may increase your risk of developing a new blood clot.  Refill your prescription before you run out.  After discharge, you should have regular check-up appointments with your healthcare provider that is prescribing your Eliquis.    What do you do if you miss a dose? If a dose of ELIQUIS is not taken at the scheduled time, take it as soon as possible on the same day and twice-daily administration should be resumed. The dose should not be doubled to make up for a missed dose.  Important Safety Information A possible side effect of Eliquis is bleeding. You should call your healthcare provider right away if you experience any of the following: Bleeding from an injury or your nose that does not stop. Unusual colored urine (red or dark brown) or unusual colored stools (red or black). Unusual bruising for unknown reasons. A serious fall or if you hit your head (even if there is no bleeding).  Some medicines may interact with Eliquis and might increase your risk of bleeding or clotting while on Eliquis. To help avoid this,  consult your healthcare provider or pharmacist prior to using any new prescription or non-prescription medications, including herbals, vitamins, non-steroidal anti-inflammatory drugs (NSAIDs) and supplements.  This website has more information on Eliquis (apixaban): http://www.eliquis.com/eliquis/home

## 2021-08-08 NOTE — Progress Notes (Signed)
°  Echocardiogram 2D Echocardiogram has been performed.  Theodore Ford 08/08/2021, 10:32 AM

## 2021-08-08 NOTE — TOC Progression Note (Signed)
Transition of Care [facility]) - Progression Note    Patient Details  Name: Theodore Ford MRN: [id number] Date of Birth: 01/28/1991  Transition of Care [facility]) CM/SW Contact  Beckie Busing, RN Phone [phone]  08/08/2021, 3:54 PM  Clinical Narrative:    CM received message that patient wants to speak with CM about meds CM called patient and patient wanted to know how to start disability process. CM has advised patient to follow up with his PCP. No other needs noted at this time.TOC will sign off     Barriers to Discharge: ED Uninsured needing medication assistance, ED Uninsured needing PCP establishment  Expected Discharge Plan and Services           Expected Discharge Date: 08/08/21                                     Social Determinants of Health (SDOH) Interventions    Readmission Risk Interventions No flowsheet data found.

## 2021-08-08 NOTE — Progress Notes (Signed)
Right lower extremity venous duplex completed. Refer to "CV Proc" under chart review to view preliminary results.  08/08/2021 2:22 PM Eula Fried., MHA, RVT, RDCS, RDMS

## 2021-08-18 ENCOUNTER — Other Ambulatory Visit (HOSPITAL_COMMUNITY): Payer: Self-pay

## 2021-08-18 ENCOUNTER — Ambulatory Visit (INDEPENDENT_AMBULATORY_CARE_PROVIDER_SITE_OTHER): Payer: Self-pay | Admitting: Internal Medicine

## 2021-08-18 VITALS — BP 133/89 | HR 81 | Ht 73.0 in | Wt 172.0 lb

## 2021-08-18 DIAGNOSIS — F32A Depression, unspecified: Secondary | ICD-10-CM

## 2021-08-18 DIAGNOSIS — I2694 Multiple subsegmental pulmonary emboli without acute cor pulmonale: Secondary | ICD-10-CM

## 2021-08-18 DIAGNOSIS — Z86718 Personal history of other venous thrombosis and embolism: Secondary | ICD-10-CM

## 2021-08-18 DIAGNOSIS — R21 Rash and other nonspecific skin eruption: Secondary | ICD-10-CM

## 2021-08-18 NOTE — Patient Instructions (Signed)
Dear Mr. Rosann Auerbach,  Thank you for trusting [place] with your care today. We discussed your blood clots and rash.  For your blood clots, please continue to take the Eliquis 5mg  once in the morning and night. We will continue this medication for 6 months. Intermittently getting up to move around and smoking cessation can help prevent this from happening again in the future.   As for your rash, we will refer you to a dermatologist. In the mean time, you may take over the counter benadryl for the itching.
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
## 2021-08-20 ENCOUNTER — Other Ambulatory Visit (HOSPITAL_COMMUNITY): Payer: Self-pay

## 2021-08-20 ENCOUNTER — Other Ambulatory Visit: Payer: Self-pay | Admitting: Internal Medicine

## 2021-08-20 ENCOUNTER — Telehealth (HOSPITAL_COMMUNITY): Payer: Self-pay | Admitting: Pharmacist

## 2021-08-20 MED ORDER — APIXABAN 5 MG PO TABS: 5.0000 mg | ORAL_TABLET | Freq: Two times a day (BID) | ORAL | 4 refills | Status: DC

## 2021-08-20 NOTE — Telephone Encounter (Signed)
Pharmacy Transitions of Care Follow-up Telephone Call   Date of discharge: 08/08/2021  Discharge Diagnosis: DVT   How have you been since you were released from the hospital?  Doing well   Medication changes made at discharge: START taking: acetaminophen (TYLENOL)  Eliquis DVT/PE Starter Pack (Apixaban Starter Pack (10mg  and 5mg ))  nicotine (NICODERM CQ - dosed in mg/24 hours)  STOP taking: doxycycline 100 MG capsule (VIBRAMYCIN)  predniSONE 50 MG tablet (DELTASONE)      Medication changes verified by the patient?  Yes     Medication Accessibility:   [facility] [place]   Was the patient provided with refills on discharged medications? No    Have all prescriptions been transferred from [facility] to home pharmacy? N/A    Is the patient able to afford medications? Per pt, he is in the process of applying for an insurance Eligible patient assistance: Patient may qualify     Medication Review:   APIXABAN (ELIQUIS)  Apixaban 10 mg BID initiated on 08/08/2021. Will switch to apixaban 5 mg BID after 7 days (08/15/2021).  - Discussed importance of taking medication around the same time everyday  - Reviewed potential DDIs with patient  - Advised patient of medications to avoid (NSAIDs, ASA)  - Educated that Tylenol (acetaminophen) will be the preferred analgesic to prevent risk of bleeding  - Emphasized importance of monitoring for signs and symptoms of bleeding (abnormal bruising, prolonged bleeding, nose bleeds, bleeding from gums, discolored urine, black tarry stools)  - Advised patient to alert all providers of anticoagulation therapy prior to starting a new medication or having a procedure      Follow-up Appointments:   PCP Hospital f/u appt confirmed?  Saw Greylon Gawaluck on 08/18/2021  @ 1:15 pm.      If their condition worsens, is the pt aware to call PCP or go to the Emergency Dept.? Yes   Final Patient Assessment:     -Pt is doing well.  -Pt verbalized  understanding of Eliquis.  -Pt already had post discharge appointment. Reached out to Dr. 10/13/2021 to [facility] [place]. -MD aware that if insurance doesn't cover Eliquis or co-pay is too high, pt may qualify for patient assistance. -Informed patient to get Eliquis coupon as well.     Burnice Logan, PharmD    Documentation  View All Documentation None

## 2021-08-23 ENCOUNTER — Encounter: Payer: Self-pay | Admitting: Internal Medicine

## 2021-08-23 DIAGNOSIS — F32A Depression, unspecified: Secondary | ICD-10-CM | POA: Insufficient documentation

## 2021-08-23 DIAGNOSIS — R21 Rash and other nonspecific skin eruption: Secondary | ICD-10-CM | POA: Insufficient documentation

## 2021-08-23 NOTE — Assessment & Plan Note (Addendum)
Patient evaluated at hospital follow up after DVT with subsegmental bilateral  PE and subsequent pulmonary infarction. Patient has a very sedentary lifestyle, pays videogames approximately 12 hours a day with minimal mobility. He also smokes tobacco. No other provoking factors noted.  No prior history of thromboembolism.  Patient feeling improved during OV. CP resolved. He has been taking his eliquis as prescribed and has transitioned to 5mg  BID.  DVT provoked by sedentary lifestyle and hx of tobacco use with PE. Will continue Eliquis for 6 months minimum due to presence of PE. If rebound DVT/PE after 6 month AC treatment, can consider hypercoag workup at that time. Also advised lifestyle modifications including tobacco cessation and increased daily activity.

## 2021-08-23 NOTE — Assessment & Plan Note (Signed)
PHQ-( score of 22 noted during hospital follow up visit. Unable to address during visit. Attempted to reach patient by telephone to discuss his score and assess desire to begin medication. Will continue to attempt to contact patient.

## 2021-08-23 NOTE — Assessment & Plan Note (Signed)
Patient reports rash present for approximately 54month. States rash began as pinpoint erythematous pruritic macules that began on right arm and spread to entire body, sparing palms, soles, face, and mucus membranes. The macules progressively enlarged to become blood filled blisters that ruptres after approximately 2 weeks. He felt feverish but did not check his temperature. Denies any nausea, vomiting, diarrhea, cough, congestion, sore throat, urinary symptoms. Denies sick contacts, no travel, new medications, does not have sex with men, no injection drug use or cocaine use, no insect bites that e is aware of. He does not have a dermatologist and has not been evaluated for this rash previously. Rash of unknown etiology. Ideally this would be evaluated with biopsy, however, no new blistering lesions were noted in office, only old scars. Advised patient to call office when new blisters emerge for biopsy, also placed referral to dermatology.

## 2021-09-04 NOTE — Progress Notes (Signed)
Internal Medicine Clinic Attending  I saw and evaluated the patient.  I personally confirmed the key portions of the history and exam documented by Dr. Burnice Logan and I reviewed pertinent patient test results.  The assessment, diagnosis, and plan were formulated together and I agree with the documentation in the residents note.  The rash is perplexing as there are no new lesions to examine - the progression from a pruritic small dark papule which then either becomes hemorrhagic or vesicular (lesions much smaller than bullous) before breaking (usually from excoriation) in an adult (no fever, no arthralgia or other systemic symptoms)  isn't something familiar to me.  This may be self-resolving (I.e. no new lesions) but if it persists he would need a biopsy.

## 2021-10-25 ENCOUNTER — Other Ambulatory Visit (HOSPITAL_COMMUNITY): Payer: Self-pay

## 2022-06-13 ENCOUNTER — Encounter (HOSPITAL_COMMUNITY): Payer: Self-pay | Admitting: Emergency Medicine

## 2022-06-13 ENCOUNTER — Emergency Department (HOSPITAL_COMMUNITY)
Admission: EM | Admit: 2022-06-13 | Discharge: 2022-06-13 | Disposition: A | Discharge status: Home / Self Care | Payer: Commercial Managed Care - HMO | Attending: Emergency Medicine | Admitting: Emergency Medicine

## 2022-06-13 ENCOUNTER — Other Ambulatory Visit: Payer: Self-pay

## 2022-06-13 DIAGNOSIS — M545 Low back pain, unspecified: Secondary | ICD-10-CM | POA: Insufficient documentation

## 2022-06-13 DIAGNOSIS — Z7901 Long term (current) use of anticoagulants: Secondary | ICD-10-CM | POA: Diagnosis not present

## 2022-06-13 MED ORDER — METHOCARBAMOL 500 MG PO TABS
500.0000 mg | ORAL_TABLET | Freq: Once | ORAL | Status: AC
Start: 2022-06-13 — End: 2022-06-13
  Administered 2022-06-13: 500 mg via ORAL
  Filled 2022-06-13: qty 1

## 2022-06-13 MED ORDER — METHOCARBAMOL 500 MG PO TABS
500.0000 mg | ORAL_TABLET | Freq: Two times a day (BID) | ORAL | 0 refills | Status: AC
Start: 2022-06-13 — End: 2022-06-20

## 2022-06-13 MED ORDER — NAPROXEN 500 MG PO TABS
500.0000 mg | ORAL_TABLET | Freq: Two times a day (BID) | ORAL | 0 refills | Status: AC
Start: 2022-06-13 — End: 2022-06-20

## 2022-06-13 MED ORDER — KETOROLAC TROMETHAMINE 30 MG/ML IJ SOLN
30.0000 mg | Freq: Once | INTRAMUSCULAR | Status: AC
  Administered 2022-06-13: 30 mg via INTRAMUSCULAR
  Filled 2022-06-13: qty 1
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
## 2022-06-13 NOTE — Discharge Instructions (Addendum)
Back Pain:  Back pain is very common.  The pain often gets better over time.  The cause of back pain is usually not dangerous.  Most people can learn to manage their back pain on their own.  However if you develop severe or worsening pain, low back pain with fever, numbness, weakness or inability to walk or urinate/stool, you should return to the ER immediately.  Please follow up with your doctor this week for a recheck if still having symptoms.  Low back pain is discomfort in the lower back that may be due to injuries to muscles and ligaments around the spine.  Occasionally, it may be caused by a a problem to a part of the spine called a disc. The pain may last several days or a week;  However, most patients get completely well in 4 weeks.  Medications are also useful to help with pain control.   Non steroidal anti inflammatory medications including Ibuprofen and naproxen;  These medications help both pain and swelling and are very useful in treating back pain.  They should be taken with food, as they can cause stomach upset, and more seriously, stomach bleeding.  I have sent in a prescription for Naproxen today that you should take as prescribed for up to 7 days.   You were given a prescription for Robaxin which is a muscle relaxer.  You should not drive, work, consume alcohol, or operate machinery while taking this medication as it can make you very drowsy.    Be aware that if you develop new symptoms, such as a fever, leg weakness, difficulty with or loss of control of your urine or bowels, abdominal pain, or more severe pain, you will need to seek medical attention and  / or return to the Emergency department.    Home Care Stay active.  Start with short walks on flat ground if you can.  Try to walk farther each day. Do not sit, drive or stand in one place for more than 30 minutes.  Do not stay in bed. Do not avoid exercise or work.  Activity can help your back heal faster. Be careful when  you bend or lift an object.  Bend at your knees, keep the object close to you, and do not twist. Sleep on a firm mattress.  Lie on your side, and bend your knees.  If you lie on your back, put a pillow under your knees. Only take medicines as told by your doctor. Put ice on the injured area. Put ice in a plastic bag Place a towel between your skin and the bag Leave the ice on for 15-20 minutes, 3-4 times a day for the first 2-3 days. 210 After that, you can switch between ice and heat packs. Ask your doctor about back exercises or massage. Avoid feeling anxious or stressed.  Find good ways to deal with stress, such as exercise.  Get Help Right Way If: Your pain does not go away with rest or medicine. Your pain does not go away in 1 week. You have new problems. You do not feel well. The pain spreads into your legs. You cannot control when you poop (bowel movement) or pee (urinate) You feel sick to your stomach (nauseous) or throw up (vomit) You have belly (abdominal) pain. You feel like you may pass out (faint). If you develop a fever.  Make Sure you: Understand these instructions. Watch your condition Get help right away if you are not doing well or get  worse.  Managing Pain  Pain after surgery or related to activity is often due to strain/injury to muscle, tendon, nerves and/or incisions.  This pain is usually short-term and will improve in a few months.   Many people find it helpful to do the following things TOGETHER to help speed the process of healing and to get back to regular activity more quickly:  Avoid heavy physical activity  no lifting greater than 20 pounds Do not "push through" the pain.  Listen to your body and avoid positions and maneuvers than reproduce the pain Walking is okay as tolerated, but go slowly and stop when getting sore.  Remember: If it hurts to do it, then don't do it! Take Anti-inflammatory medication  Take with food/snack around the clock for  1-2 weeks This helps the muscle and nerve tissues become less irritable and calm down faster Choose ONE of the following over-the-counter medications: Naproxen 220mg  tabs (ex. Aleve) 1-2 pills twice a day  Ibuprofen 200mg  tabs (ex. Advil, Motrin) 3-4 pills with every meal and just before bedtime Acetaminophen 500mg  tabs (Tylenol) 1-2 pills with every meal and just before bedtime Use a Heating pad or Ice/Cold Pack 4-6 times a day May use warm bath/hottub  or showers Try Gentle Massage and/or Stretching  at the area of pain many times a day stop if you feel pain - do not overdo it  Try these steps together to help you body heal faster and avoid making things get worse.  Doing just one of these things may not be enough.    If you are not getting better after two weeks or are noticing you are getting worse, contact our office for further advice; we may need to re-evaluate you & see what other things we can do to help.

## 2022-06-16 ENCOUNTER — Other Ambulatory Visit: Payer: Self-pay

## 2022-06-16 ENCOUNTER — Emergency Department (HOSPITAL_COMMUNITY)
Admission: EM | Admit: 2022-06-16 | Discharge: 2022-06-17 | Disposition: A | Discharge status: Home / Self Care | Payer: Commercial Managed Care - HMO | Attending: Emergency Medicine | Admitting: Emergency Medicine

## 2022-06-16 ENCOUNTER — Encounter (HOSPITAL_COMMUNITY): Payer: Self-pay

## 2022-06-16 DIAGNOSIS — J189 Pneumonia, unspecified organism: Secondary | ICD-10-CM | POA: Insufficient documentation

## 2022-06-16 DIAGNOSIS — F17200 Nicotine dependence, unspecified, uncomplicated: Secondary | ICD-10-CM | POA: Insufficient documentation

## 2022-06-16 DIAGNOSIS — Z7901 Long term (current) use of anticoagulants: Secondary | ICD-10-CM | POA: Diagnosis not present

## 2022-06-16 DIAGNOSIS — E876 Hypokalemia: Secondary | ICD-10-CM | POA: Insufficient documentation

## 2022-06-16 DIAGNOSIS — R531 Weakness: Secondary | ICD-10-CM | POA: Diagnosis present

## 2022-06-16 DIAGNOSIS — Z1152 Encounter for screening for COVID-19: Secondary | ICD-10-CM | POA: Diagnosis not present

## 2022-06-16 LAB — RESP PANEL BY RT-PCR (FLU A&B, COVID) ARPGX2
Influenza A by PCR: NEGATIVE
Influenza B by PCR: NEGATIVE
SARS Coronavirus 2 by RT PCR: NEGATIVE

## 2022-06-16 LAB — COMPREHENSIVE METABOLIC PANEL
ALT: 14 U/L (ref 0–44)
AST: 21 U/L (ref 15–41)
Albumin: 3.6 g/dL (ref 3.5–5.0)
Alkaline Phosphatase: 62 U/L (ref 38–126)
Anion gap: 11 (ref 5–15)
BUN: 11 mg/dL (ref 6–20)
CO2: 23 mmol/L (ref 22–32)
Calcium: 8.4 mg/dL — ABNORMAL LOW (ref 8.9–10.3)
Chloride: 100 mmol/L (ref 98–111)
Creatinine, Ser: 1.22 mg/dL (ref 0.61–1.24)
GFR, Estimated: >60 mL/min (ref >60)
Glucose, Bld: 132 mg/dL — ABNORMAL HIGH (ref 70–99)
Potassium: 3.1 mmol/L — ABNORMAL LOW (ref 3.5–5.1)
Sodium: 134 mmol/L — ABNORMAL LOW (ref 135–145)
Total Bilirubin: 0.9 mg/dL (ref 0.3–1.2)
Total Protein: 7.1 g/dL (ref 6.5–8.1)

## 2022-06-16 LAB — URINALYSIS, ROUTINE W REFLEX MICROSCOPIC
Bilirubin Urine: NEGATIVE
Glucose, UA: 50 mg/dL — AB
Ketones, ur: NEGATIVE mg/dL
Leukocytes,Ua: NEGATIVE
Nitrite: NEGATIVE
Protein, ur: >=300 mg/dL — AB
Specific Gravity, Urine: 1.025 (ref 1.005–1.030)
pH: 5 (ref 5.0–8.0)

## 2022-06-16 LAB — CBC WITH DIFFERENTIAL/PLATELET
Abs Immature Granulocytes: 0.09 10*3/uL — ABNORMAL HIGH (ref 0.00–0.07)
Basophils Absolute: 0 10*3/uL (ref 0.0–0.1)
Basophils Relative: 0 %
Eosinophils Absolute: 0 10*3/uL (ref 0.0–0.5)
Eosinophils Relative: 0 %
HCT: 43.8 % (ref 39.0–52.0)
Hemoglobin: 14.8 g/dL (ref 13.0–17.0)
Immature Granulocytes: 1 %
Lymphocytes Relative: 11 %
Lymphs Abs: 1.6 10*3/uL (ref 0.7–4.0)
MCH: 32 pg (ref 26.0–34.0)
MCHC: 33.8 g/dL (ref 30.0–36.0)
MCV: 94.6 fL (ref 80.0–100.0)
Monocytes Absolute: 1.4 10*3/uL — ABNORMAL HIGH (ref 0.1–1.0)
Monocytes Relative: 10 %
Neutro Abs: 11.3 10*3/uL — ABNORMAL HIGH (ref 1.7–7.7)
Neutrophils Relative %: 78 %
Platelets: 186 10*3/uL (ref 150–400)
RBC: 4.63 MIL/uL (ref 4.22–5.81)
RDW: 12.7 % (ref 11.5–15.5)
WBC: 14.4 10*3/uL — ABNORMAL HIGH (ref 4.0–10.5)
nRBC: 0 % (ref 0.0–0.2)

## 2022-06-16 LAB — CK: Total CK: 155 U/L (ref 49–397)

## 2022-06-16 LAB — D-DIMER, QUANTITATIVE: D-Dimer, Quant: 0.79 ug/mL-FEU — ABNORMAL HIGH (ref 0.00–0.50)

## 2022-06-16 MED ORDER — ACETAMINOPHEN 500 MG PO TABS
1000.0000 mg | ORAL_TABLET | Freq: Once | ORAL | Status: AC
  Administered 2022-06-16: 1000 mg via ORAL
  Filled 2022-06-16: qty 2

## 2022-06-16 NOTE — ED Triage Notes (Signed)
Pt reports 1 week of generalized body aches, weak spells, no appetite, fever. Denies N/V. Pt reports SOB while resting, breathing is even and unlabored. Pt also reports dizziness/lightheadedness

## 2022-06-16 NOTE — ED Provider Triage Note (Signed)
Emergency Medicine Provider Triage Evaluation Note  Theodore Ford , a 31 y.o. male  was evaluated in triage.  Pt complains of full bodyaches with cough and cold symptoms. Reports some SOB. On chart review, patient had a PE in Dec 2022 and did not not take his blood thinner.  Review of Systems  Positive:  Negative:   Physical Exam  BP 117/81 (BP Location: Left Arm)   Pulse (!) 106   Temp (!) 102.4 F (39.1 C) (Oral)   Resp 18   SpO2 93%  Gen:   Awake, no distress   Resp:  Normal effort  MSK:   Moves extremities without difficulty  Other:    Medical Decision Making  Medically screening exam initiated at 8:06 PM.  Appropriate orders placed.  Theodore Ford was informed that the remainder of the evaluation will be completed by another provider, this initial triage assessment does not replace that evaluation, and the importance of remaining in the ED until their evaluation is complete.  Febrile. Tylenol ordered. Labs and Swab ordered.    Sherrell Puller, PA-C 06/16/22 2008

## 2022-06-16 NOTE — ED Triage Notes (Signed)
Pt seen a couple days ago and prescribed a muscle relaxer for chronic back pain. Pt states he can't afford the prescription.

## 2022-06-17 ENCOUNTER — Emergency Department (HOSPITAL_COMMUNITY): Payer: Commercial Managed Care - HMO

## 2022-06-17 ENCOUNTER — Encounter (HOSPITAL_COMMUNITY): Payer: Self-pay

## 2022-06-17 LAB — GROUP A STREP BY PCR: Group A Strep by PCR: NOT DETECTED

## 2022-06-17 MED ORDER — ALBUTEROL SULFATE HFA 108 (90 BASE) MCG/ACT IN AERS
2.0000 | INHALATION_SPRAY | RESPIRATORY_TRACT | Status: DC | PRN
  Administered 2022-06-17: 2 via RESPIRATORY_TRACT
  Filled 2022-06-17: qty 6.7

## 2022-06-17 MED ORDER — IOHEXOL 350 MG/ML SOLN
100.0000 mL | Freq: Once | INTRAVENOUS | Status: AC | PRN
  Administered 2022-06-17: 100 mL via INTRAVENOUS

## 2022-06-17 MED ORDER — DOXYCYCLINE HYCLATE 100 MG PO TABS
100.0000 mg | ORAL_TABLET | Freq: Once | ORAL | Status: AC
  Administered 2022-06-17: 100 mg via ORAL
  Filled 2022-06-17: qty 1

## 2022-06-17 MED ORDER — POTASSIUM CHLORIDE CRYS ER 20 MEQ PO TBCR
40.0000 meq | EXTENDED_RELEASE_TABLET | Freq: Once | ORAL | Status: AC
  Administered 2022-06-17: 40 meq via ORAL
  Filled 2022-06-17: qty 2

## 2022-06-17 MED ORDER — SODIUM CHLORIDE (PF) 0.9 % IJ SOLN
INTRAMUSCULAR | Status: AC
  Filled 2022-06-17: qty 50

## 2022-06-17 MED ORDER — DOXYCYCLINE HYCLATE 100 MG PO CAPS: 100.0000 mg | ORAL_CAPSULE | Freq: Two times a day (BID) | ORAL | 0 refills | Status: DC

## 2022-06-17 MED ORDER — AEROCHAMBER Z-STAT PLUS/MEDIUM MISC
1.0000 | Freq: Once | Status: AC
  Administered 2022-06-17: 1
  Filled 2022-06-17: qty 1

## 2022-06-17 MED ORDER — SODIUM CHLORIDE 0.9 % IV BOLUS
1000.0000 mL | Freq: Once | INTRAVENOUS | Status: AC
  Administered 2022-06-17: 1000 mL via INTRAVENOUS
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
## 2022-11-29 ENCOUNTER — Encounter (HOSPITAL_COMMUNITY): Payer: Self-pay | Admitting: *Deleted

## 2022-11-29 ENCOUNTER — Other Ambulatory Visit: Payer: Self-pay

## 2022-11-29 ENCOUNTER — Emergency Department (HOSPITAL_COMMUNITY)
Admission: EM | Admit: 2022-11-29 | Discharge: 2022-11-29 | Discharge status: Against Medical Advice | Payer: MEDICAID | Attending: Emergency Medicine | Admitting: Emergency Medicine

## 2022-11-29 DIAGNOSIS — W269XXA Contact with unspecified sharp object(s), initial encounter: Secondary | ICD-10-CM | POA: Insufficient documentation

## 2022-11-29 DIAGNOSIS — Y9389 Activity, other specified: Secondary | ICD-10-CM | POA: Insufficient documentation

## 2022-11-29 DIAGNOSIS — Z5321 Procedure and treatment not carried out due to patient leaving prior to being seen by health care provider: Secondary | ICD-10-CM | POA: Insufficient documentation

## 2022-11-29 DIAGNOSIS — S41119A Laceration without foreign body of unspecified upper arm, initial encounter: Secondary | ICD-10-CM | POA: Insufficient documentation
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
## 2022-12-22 ENCOUNTER — Ambulatory Visit
Admission: EM | Admit: 2022-12-22 | Discharge: 2022-12-22 | Disposition: A | Discharge status: Home / Self Care | Payer: Self-pay | Attending: Internal Medicine | Admitting: Internal Medicine

## 2022-12-22 DIAGNOSIS — G43809 Other migraine, not intractable, without status migrainosus: Secondary | ICD-10-CM

## 2022-12-22 MED ORDER — IBUPROFEN 600 MG PO TABS: 600.0000 mg | ORAL_TABLET | Freq: Four times a day (QID) | ORAL | 0 refills | Status: AC | PRN

## 2022-12-22 NOTE — Discharge Instructions (Signed)
I have prescribed you ibuprofen to take for pain.  Please do not take any additional ibuprofen, Advil, Aleve.  Go straight to the emergency department if headache persists or worsens in the next 24 to 48 hours.

## 2022-12-22 NOTE — ED Triage Notes (Signed)
Patient with c/o headache x3 days. Patient states he didn't take any OTC medicine because he only likes to take medicine he is prescribed.
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
## 2023-05-20 ENCOUNTER — Ambulatory Visit (HOSPITAL_COMMUNITY)
Admission: EM | Admit: 2023-05-20 | Discharge: 2023-05-20 | Disposition: A | Discharge status: Home / Self Care | Payer: 59 | Attending: Emergency Medicine | Admitting: Emergency Medicine

## 2023-05-20 ENCOUNTER — Encounter (HOSPITAL_COMMUNITY): Payer: Self-pay

## 2023-05-20 DIAGNOSIS — H6123 Impacted cerumen, bilateral: Secondary | ICD-10-CM | POA: Insufficient documentation

## 2023-05-20 DIAGNOSIS — Z113 Encounter for screening for infections with a predominantly sexual mode of transmission: Secondary | ICD-10-CM | POA: Diagnosis not present

## 2023-05-20 LAB — HEPATITIS PANEL, ACUTE
HCV Ab: NONREACTIVE
Hep A IgM: NONREACTIVE
Hep B C IgM: NONREACTIVE
Hepatitis B Surface Ag: NONREACTIVE

## 2023-05-20 LAB — HIV ANTIBODY (ROUTINE TESTING W REFLEX): HIV Screen 4th Generation wRfx: NONREACTIVE

## 2023-05-20 NOTE — Discharge Instructions (Addendum)
Blood work sent out today to test for herpes as well as HIV, syphilis, and hepatitis. We will contact you with results. If you test positive for herpes, no treatment is needed unless you develop symptoms such as mouth or genital sores.  Ears cleaned today. Follow-up with ENT if ongoing concerns.

## 2023-05-20 NOTE — ED Triage Notes (Signed)
Patient here today to be tested for STDs through blood. He does not want to do the swab. Patient states that someone that he was with told him that they had herpes so he is concerned. He is not having any symptoms.  Patient would also like his ears checked to see if they need to be cleaned.
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
## 2023-05-21 LAB — HSV(HERPES SIMPLEX VRS) I + II AB-IGG
HSV 1 Glycoprotein G Ab, IgG: REACTIVE — AB
HSV 2 Glycoprotein G Ab, IgG: NONREACTIVE

## 2023-05-21 LAB — RPR: RPR Ser Ql: NONREACTIVE

## 2023-05-21 IMAGING — CT CT ANGIO CHEST
2 of 6 series · 18 of 36 positions shown · IV contrast (omnipaque)
Comparison: Chest radiograph from earlier today. 09/03/2015 chest
CT.

CLINICAL DATA: pleuritic chest and back pain, elevated dimer

EXAM:
CT ANGIOGRAPHY CHEST WITH CONTRAST
TECHNIQUE: Multidetector CT imaging of the chest was performed using the
standard protocol during bolus administration of intravenous
contrast. Multiplanar CT image reconstructions and MIPs were
obtained to evaluate the vascular anatomy.
CONTRAST:  65mL OMNIPAQUE IOHEXOL 350 MG/ML SOLN

[Series 7: pe thins · axial · 0.80mm/px · z∈[+1056,+1338]mm · 17 of 450 slices shown]
[im 23/450  lung]
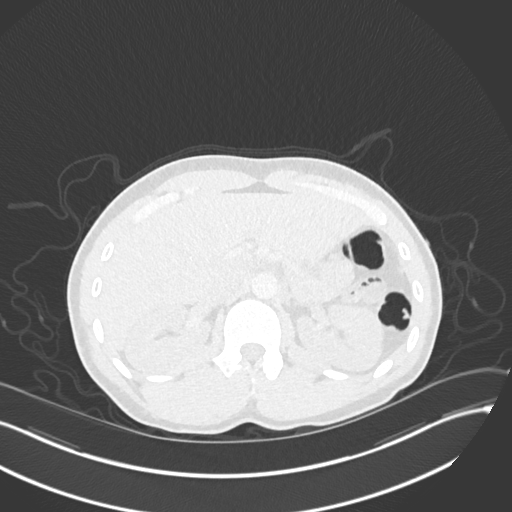
[im 45/450  mediastinal]
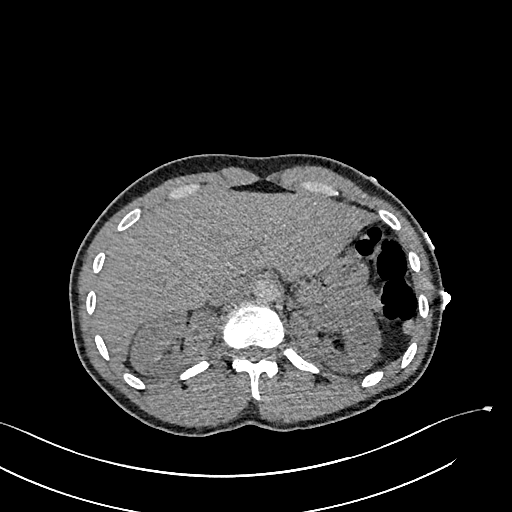
[im 68/450  lung]
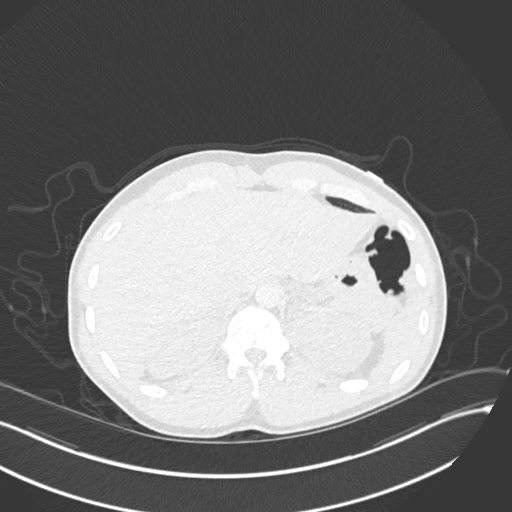
[im 90/450  mediastinal]
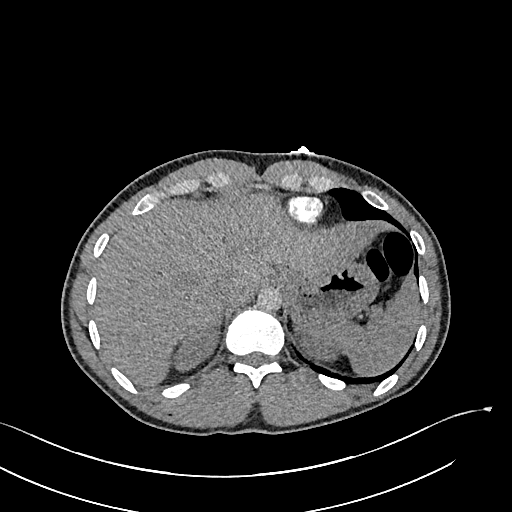
[im 135/450  lung]
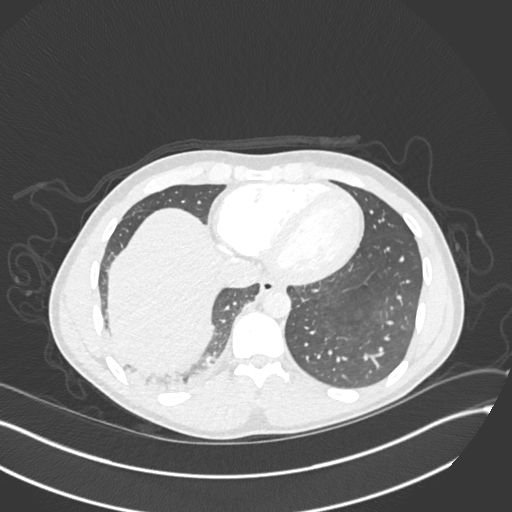
[im 158/450  mediastinal]
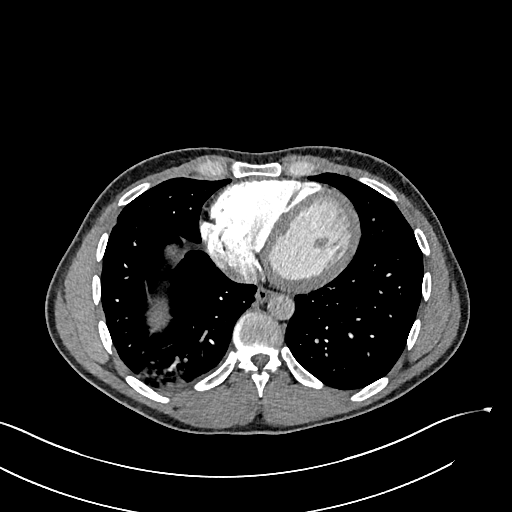
[im 180/450  lung]
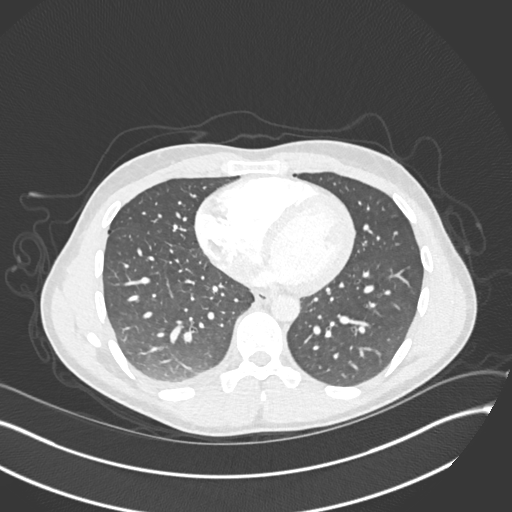
[im 203/450  mediastinal]
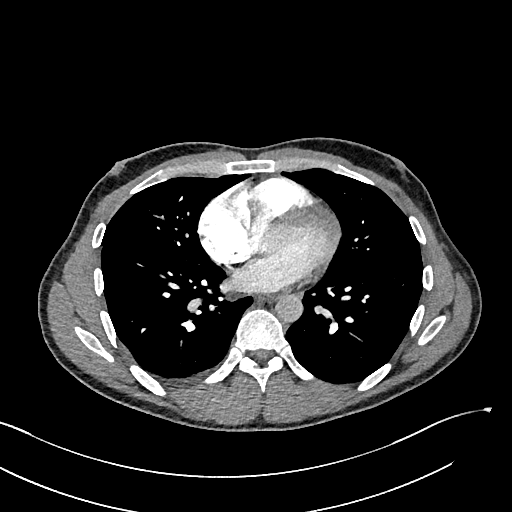
[im 225/450  lung]
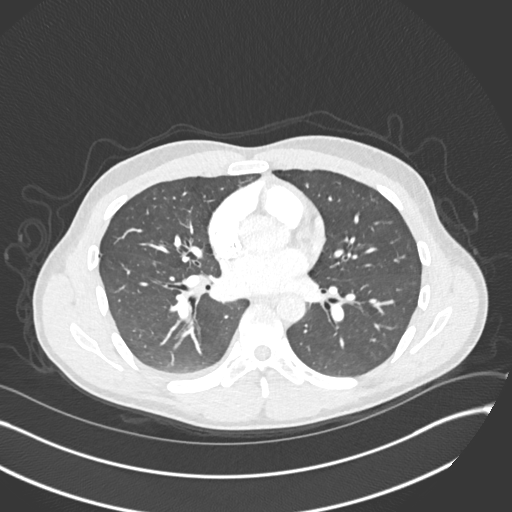
[im 247/450  mediastinal]
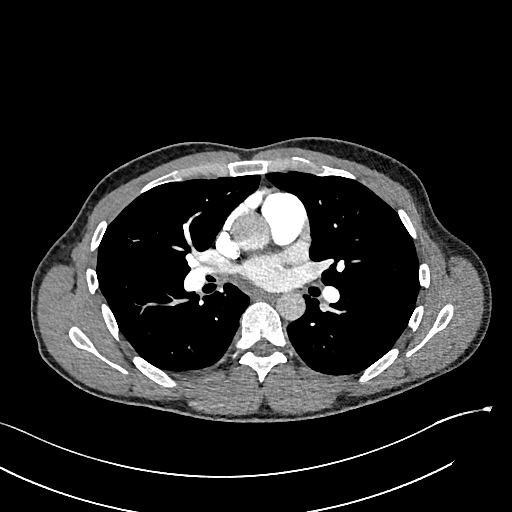
[im 270/450  lung]
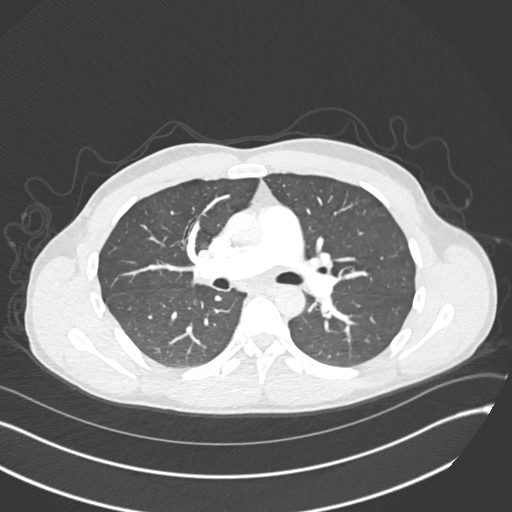
[im 292/450  mediastinal]
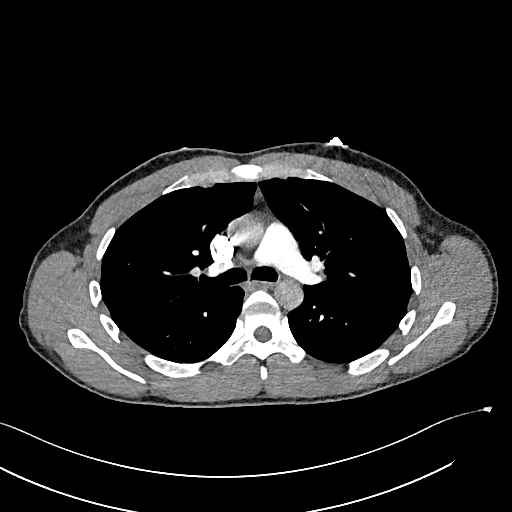
[im 315/450  lung]
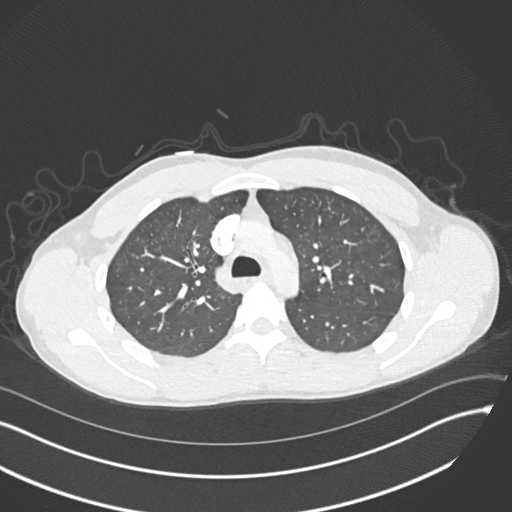
[im 360/450  mediastinal]
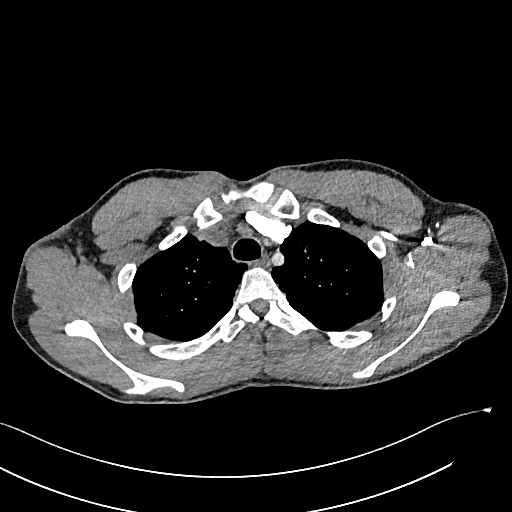
[im 382/450  lung]
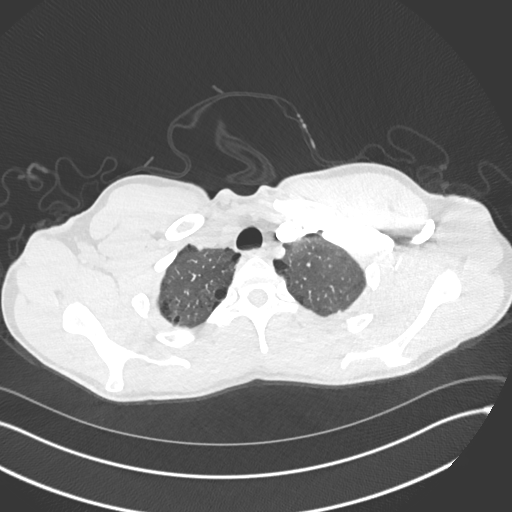
[im 405/450  mediastinal]
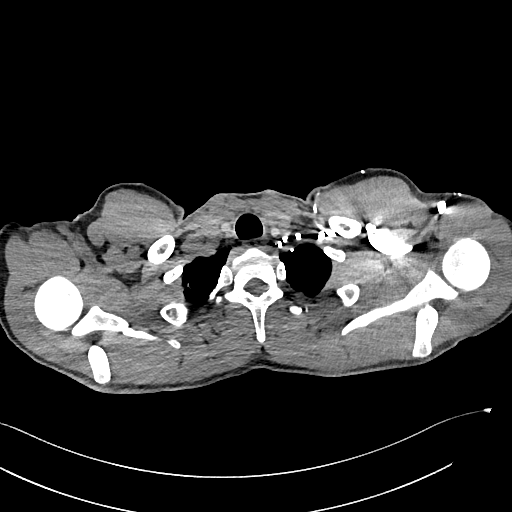
[im 427/450  lung]
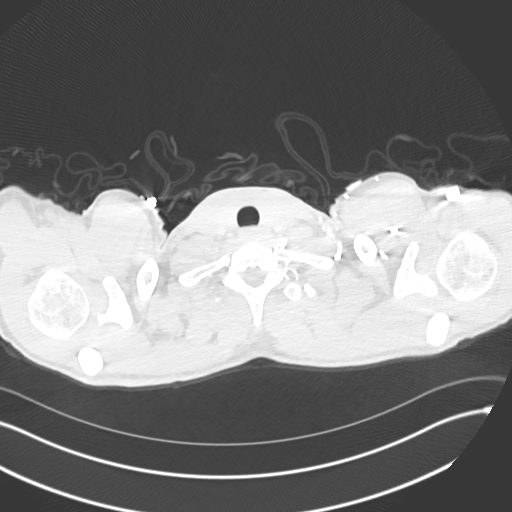

[Series 8: pe 2mm cor · coronal · 0.59mm/px · 1 of 150 slices shown]
[im 75/150  mediastinal]
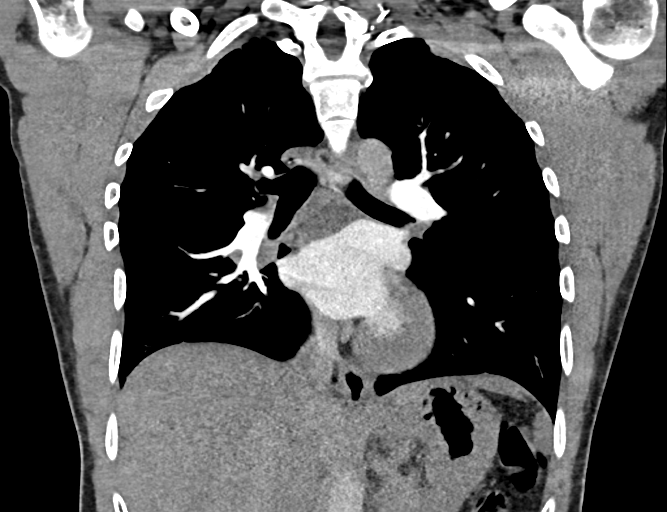

[18 of 36 positions shown; findings below may reference images not displayed]

FINDINGS: Cardiovascular: The study is high quality for the evaluation of
pulmonary embolism. Acute subsegmental pulmonary embolism in the
right lower lobe (series 7/images [id number]). No additional foci of
acute pulmonary embolism. Great vessels are normal in course and
caliber. Normal heart size. No significant pericardial
fluid/thickening.

Mediastinum/Nodes: No discrete thyroid nodules. Unremarkable
esophagus. No pathologically enlarged axillary, mediastinal or hilar
lymph nodes.

Lungs/Pleura: No pneumothorax. Trace dependent right pleural
effusion. No left pleural effusion. Mild centrilobular and
paraseptal emphysema with mild diffuse bronchial wall thickening.
Patchy consolidation and ground-glass opacity in the dependent
basilar right lower lobe (series 6/image 103) with a vaguely
triangular configuration. No lung masses or significant pulmonary
nodules.

Upper abdomen: No acute abnormality.

Musculoskeletal:  No aggressive appearing focal osseous lesions.

Review of the MIP images confirms the above findings.
IMPRESSION: 1. Acute subsegmental right lower lobe pulmonary embolism.
2. Patchy consolidation and ground-glass opacity in the dependent
basilar right lower lobe with a vaguely triangular configuration,
compatible with an acute pulmonary infarct.
3. Trace dependent right pleural effusion.
4. Mild emphysema with mild diffuse bronchial wall thickening,
suggesting COPD.
5. Emphysema ([id number]).

Critical Value/emergent results were called by telephone at the time
of interpretation on 08/07/2021 at [DATE] to provider DR. NEWBERRY
MAGNOLIA, who verbally acknowledged these results.

## 2023-06-17 DIAGNOSIS — G8929 Other chronic pain: Secondary | ICD-10-CM | POA: Diagnosis not present

## 2023-06-17 DIAGNOSIS — Z91199 Patient's noncompliance with other medical treatment and regimen due to unspecified reason: Secondary | ICD-10-CM | POA: Diagnosis not present

## 2023-06-17 DIAGNOSIS — Z720 Tobacco use: Secondary | ICD-10-CM | POA: Diagnosis not present

## 2024-03-25 ENCOUNTER — Other Ambulatory Visit: Payer: Self-pay

## 2024-03-25 ENCOUNTER — Encounter (HOSPITAL_COMMUNITY): Payer: Self-pay | Admitting: *Deleted

## 2024-03-25 ENCOUNTER — Emergency Department (HOSPITAL_COMMUNITY)
Admission: EM | Admit: 2024-03-25 | Discharge: 2024-03-25 | Disposition: A | Discharge status: Home / Self Care | Payer: MEDICAID | Attending: Emergency Medicine | Admitting: Emergency Medicine

## 2024-03-25 DIAGNOSIS — Z202 Contact with and (suspected) exposure to infections with a predominantly sexual mode of transmission: Secondary | ICD-10-CM | POA: Insufficient documentation

## 2024-03-25 DIAGNOSIS — Z711 Person with feared health complaint in whom no diagnosis is made: Secondary | ICD-10-CM

## 2024-03-25 LAB — URINALYSIS, ROUTINE W REFLEX MICROSCOPIC
Bilirubin Urine: NEGATIVE
Glucose, UA: NEGATIVE mg/dL
Ketones, ur: 5 mg/dL — AB
Nitrite: NEGATIVE
Protein, ur: 30 mg/dL — AB
Specific Gravity, Urine: 1.027 (ref 1.005–1.030)
WBC, UA: >50 WBC/hpf (ref 0–5)
pH: 5 (ref 5.0–8.0)

## 2024-03-25 LAB — CBC
HCT: 40.3 % (ref 39.0–52.0)
Hemoglobin: 13.5 g/dL (ref 13.0–17.0)
MCH: 32.8 pg (ref 26.0–34.0)
MCHC: 33.5 g/dL (ref 30.0–36.0)
MCV: 97.8 fL (ref 80.0–100.0)
Platelets: 274 K/uL (ref 150–400)
RBC: 4.12 MIL/uL — ABNORMAL LOW (ref 4.22–5.81)
RDW: 13 % (ref 11.5–15.5)
WBC: 8.5 K/uL (ref 4.0–10.5)
nRBC: 0 % (ref 0.0–0.2)

## 2024-03-25 LAB — COMPREHENSIVE METABOLIC PANEL WITH GFR
ALT: 17 U/L (ref 0–44)
AST: 26 U/L (ref 15–41)
Albumin: 4.1 g/dL (ref 3.5–5.0)
Alkaline Phosphatase: 60 U/L (ref 38–126)
Anion gap: 10 (ref 5–15)
BUN: 11 mg/dL (ref 6–20)
CO2: 25 mmol/L (ref 22–32)
Calcium: 9.1 mg/dL (ref 8.9–10.3)
Chloride: 103 mmol/L (ref 98–111)
Creatinine, Ser: 1.01 mg/dL (ref 0.61–1.24)
GFR, Estimated: >60 mL/min (ref >60)
Glucose, Bld: 95 mg/dL (ref 70–99)
Potassium: 3.5 mmol/L (ref 3.5–5.1)
Sodium: 138 mmol/L (ref 135–145)
Total Bilirubin: 0.9 mg/dL (ref 0.0–1.2)
Total Protein: 7 g/dL (ref 6.5–8.1)

## 2024-03-25 LAB — LIPASE, BLOOD: Lipase: 23 U/L (ref 11–51)

## 2024-03-25 LAB — RPR: RPR Ser Ql: NONREACTIVE

## 2024-03-25 LAB — HIV ANTIBODY (ROUTINE TESTING W REFLEX): HIV Screen 4th Generation wRfx: NONREACTIVE

## 2024-03-25 MED ORDER — STERILE WATER FOR INJECTION IJ SOLN
INTRAMUSCULAR | Status: AC
Start: 2024-03-25 — End: 2024-03-25
  Filled 2024-03-25: qty 10

## 2024-03-25 MED ORDER — DOXYCYCLINE HYCLATE 100 MG PO TABS
100.0000 mg | ORAL_TABLET | Freq: Once | ORAL | Status: AC
  Administered 2024-03-25: 100 mg via ORAL
  Filled 2024-03-25: qty 1

## 2024-03-25 MED ORDER — DOXYCYCLINE HYCLATE 100 MG PO CAPS: 100.0000 mg | ORAL_CAPSULE | Freq: Two times a day (BID) | ORAL | 0 refills | Status: AC

## 2024-03-25 MED ORDER — CEFTRIAXONE SODIUM 500 MG IJ SOLR
500.0000 mg | Freq: Once | INTRAMUSCULAR | Status: AC
  Administered 2024-03-25: 500 mg via INTRAMUSCULAR
  Filled 2024-03-25: qty 500

## 2024-03-25 NOTE — ED Triage Notes (Signed)
 Painful urination for 2 days and a yellow discharge  temp unknown

## 2024-03-25 NOTE — ED Provider Notes (Signed)
 [facility] Provider Note   CSN: [id number] Arrival date & time: 03/25/24  9795     Patient presents with: No chief complaint on file.   Theodore Ford is a 33 y.o. male.   Patient with no pertinent past medical history presents today with complaints of dysuria. Reports that he has had dysuria for the past 2 days as well as yellow penile discharge. Reports he is sexually active, does have concern for STD. Denies abdominal pain or testicular pain.  No fevers, chills, nausea, vomiting, or diarrhea.   The history is provided by the patient. No language interpreter was used.       Prior to Admission medications   Medication Sig Start Date End Date Taking? Authorizing Provider  ibuprofen  (ADVIL ) 600 MG tablet Take 1 tablet (600 mg total) by mouth every 6 (six) hours as needed for headache. 12/22/22   Hazen Darryle BRAVO, FNP    Allergies: Patient has no known allergies.    Review of Systems  Genitourinary:  Positive for penile discharge.  All other systems reviewed and are negative.   Updated Vital Signs BP 106/73 (BP Location: Left Arm)   Pulse (!) 58   Temp 97.7 F (36.5 C)   Resp (!) 21   Ht 6' (1.829 m)   Wt 74.4 kg   SpO2 99%   BMI 22.25 kg/m   Physical Exam Vitals and nursing note reviewed.  Constitutional:      General: He is not in acute distress.    Appearance: Normal appearance. He is normal weight. He is not ill-appearing, toxic-appearing or diaphoretic.  HENT:     Head: Normocephalic and atraumatic.  Cardiovascular:     Rate and Rhythm: Normal rate.  Pulmonary:     Effort: Pulmonary effort is normal. No respiratory distress.  Abdominal:     General: Abdomen is flat.     Palpations: Abdomen is soft.     Tenderness: There is no abdominal tenderness.  Musculoskeletal:        General: Normal range of motion.     Cervical back: Normal range of motion.  Skin:    General: Skin is warm and dry.  Neurological:      General: No focal deficit present.     Mental Status: He is alert.  Psychiatric:        Mood and Affect: Mood normal.        Behavior: Behavior normal.     (all labs ordered are listed, but only abnormal results are displayed) Labs Reviewed  CBC - Abnormal; Notable for the following components:      Result Value   RBC 4.12 (*)    All other components within normal limits  URINALYSIS, ROUTINE W REFLEX MICROSCOPIC - Abnormal; Notable for the following components:   APPearance CLOUDY (*)    Hgb urine dipstick SMALL (*)    Ketones, ur 5 (*)    Protein, ur 30 (*)    Leukocytes,Ua MODERATE (*)    Bacteria, UA RARE (*)    All other components within normal limits  LIPASE, BLOOD  COMPREHENSIVE METABOLIC PANEL WITH GFR  RPR  HIV ANTIBODY (ROUTINE TESTING W REFLEX)  GC/CHLAMYDIA PROBE AMP ([place]) NOT AT [facility]    EKG: None  Radiology: No results found.   Procedures   Medications Ordered in the ED - No data to display  Medical Decision Making Amount and/or Complexity of Data Reviewed Labs: ordered.   Patient presents today with concern for dysuria, penile discharge.  Patient is afebrile without abdominal tenderness, abdominal pain or painful bowel movements to indicate prostatitis.  No tenderness to palpation of the testes or epididymis to suggest orchitis or epididymitis.  STD cultures obtained including HIV, syphilis, gonorrhea and chlamydia.  UA collected as well which does show signs of infection, likely STI etiology.  Labs otherwise benign.  Patient to be discharged with instructions to follow up with PCP. Discussed importance of using protection when sexually active. Pt understands that they have GC/Chlamydia cultures pending and that they will need to inform all sexual partners if results return positive. Patient has been treated prophylactically with doxycycline  and Rocephin . Evaluation and diagnostic testing in the emergency  department does not suggest an emergent condition requiring admission or immediate intervention beyond what has been performed at this time.  Plan for discharge with close PCP follow-up.  Patient is understanding and amenable with plan, educated on red flag symptoms that would prompt immediate return.  Patient discharged in stable condition.  Final diagnoses:  Concern about STD in male without diagnosis    ED Discharge Orders          Ordered    doxycycline  (VIBRAMYCIN ) 100 MG capsule  2 times daily        03/25/24 1212          An After Visit Summary was printed and given to the patient.      Nora Lauraine LABOR, PA-C 03/25/24 1215    Elnor Jayson LABOR, DO 03/26/24 [id number]

## 2024-03-25 NOTE — ED Notes (Signed)
 D/c papers reviewed and pt expressed understanding.

## 2024-03-25 NOTE — Discharge Instructions (Addendum)
 As we discussed, your gonorrhea and chlamydia testing is pending at your discharge.  You were also tested for HIV and syphilis which are also pending.  These take a few days to come back.  Please monitor your MyChart for these results.  We have gone ahead and treated you for gonorrhea and chlamydia given your symptoms.  Please fill and take the antibiotics as prescribed in its entirety.  If anything comes back positive you will need to inform all sexual partners.  Please abstain from sexual intercourse until you can follow-up with your PCP for test of cure.  Please call to schedule an appointment at your earliest convenience.  In the future, please use protection when having sexual intercourse to reduce chances of STD transmission.  Additionally, you can go to the health department for testing that is free.  Return if development of any new or worsening symptoms.

## 2024-03-27 LAB — GC/CHLAMYDIA PROBE AMP (~~LOC~~) NOT AT ARMC
Chlamydia: NEGATIVE
Comment: NEGATIVE
Comment: NORMAL
Neisseria Gonorrhea: POSITIVE — AB

## 2024-03-28 ENCOUNTER — Ambulatory Visit (HOSPITAL_COMMUNITY): Payer: Self-pay
